# Patient Record
Sex: Female | Born: 1965 | Race: White | Hispanic: No | Smoking: Never smoker
Health system: Southern US, Community
[De-identification: ages and names within clinical notes are randomized; demographics above are authoritative.]

## PROBLEM LIST (undated history)

## (undated) DIAGNOSIS — Z789 Other specified health status: Secondary | ICD-10-CM

## (undated) DIAGNOSIS — Z8489 Family history of other specified conditions: Secondary | ICD-10-CM

## (undated) DIAGNOSIS — Z90710 Acquired absence of both cervix and uterus: Secondary | ICD-10-CM

## (undated) DIAGNOSIS — G43909 Migraine, unspecified, not intractable, without status migrainosus: Secondary | ICD-10-CM

## (undated) DIAGNOSIS — N83209 Unspecified ovarian cyst, unspecified side: Secondary | ICD-10-CM

## (undated) HISTORY — DX: Migraine, unspecified, not intractable, without status migrainosus: G43.909

## (undated) HISTORY — PX: OTHER SURGICAL HISTORY: SHX169

## (undated) HISTORY — DX: Unspecified ovarian cyst, unspecified side: N83.209

## (undated) HISTORY — PX: NO PAST SURGERIES: SHX2092

---

## 1998-03-08 ENCOUNTER — Other Ambulatory Visit: Admission: RE | Admit: 1998-03-08 | Discharge: 1998-03-08 | Payer: Self-pay | Admitting: Obstetrics & Gynecology

## 1999-01-19 ENCOUNTER — Other Ambulatory Visit: Admission: RE | Admit: 1999-01-19 | Discharge: 1999-01-19 | Payer: Self-pay | Admitting: Obstetrics & Gynecology

## 1999-04-24 ENCOUNTER — Other Ambulatory Visit: Admission: RE | Admit: 1999-04-24 | Discharge: 1999-04-24 | Payer: Self-pay | Admitting: Obstetrics & Gynecology

## 1999-04-24 ENCOUNTER — Encounter (INDEPENDENT_AMBULATORY_CARE_PROVIDER_SITE_OTHER): Payer: Self-pay | Admitting: Specialist

## 1999-07-30 ENCOUNTER — Other Ambulatory Visit: Admission: RE | Admit: 1999-07-30 | Discharge: 1999-07-30 | Payer: Self-pay | Admitting: Obstetrics & Gynecology

## 1999-12-12 ENCOUNTER — Other Ambulatory Visit: Admission: RE | Admit: 1999-12-12 | Discharge: 1999-12-12 | Payer: Self-pay | Admitting: Obstetrics & Gynecology

## 2001-01-02 ENCOUNTER — Other Ambulatory Visit: Admission: RE | Admit: 2001-01-02 | Discharge: 2001-01-02 | Payer: Self-pay | Admitting: Obstetrics & Gynecology

## 2002-01-18 ENCOUNTER — Other Ambulatory Visit: Admission: RE | Admit: 2002-01-18 | Discharge: 2002-01-18 | Payer: Self-pay | Admitting: Obstetrics & Gynecology

## 2003-04-18 ENCOUNTER — Other Ambulatory Visit: Admission: RE | Admit: 2003-04-18 | Discharge: 2003-04-18 | Payer: Self-pay | Admitting: Obstetrics & Gynecology

## 2004-01-03 ENCOUNTER — Encounter: Admission: RE | Admit: 2004-01-03 | Discharge: 2004-01-03 | Payer: Self-pay | Admitting: Family Medicine

## 2005-01-06 ENCOUNTER — Encounter: Payer: Self-pay | Admitting: Family Medicine

## 2005-01-21 ENCOUNTER — Other Ambulatory Visit: Admission: RE | Admit: 2005-01-21 | Discharge: 2005-01-21 | Payer: Self-pay | Admitting: Obstetrics & Gynecology

## 2005-02-07 ENCOUNTER — Ambulatory Visit: Payer: Self-pay | Admitting: Family Medicine

## 2005-04-25 ENCOUNTER — Ambulatory Visit: Payer: Self-pay | Admitting: Family Medicine

## 2007-09-10 ENCOUNTER — Ambulatory Visit: Payer: Self-pay | Admitting: Family Medicine

## 2007-09-17 ENCOUNTER — Encounter: Payer: Self-pay | Admitting: Family Medicine

## 2007-09-17 DIAGNOSIS — Z87898 Personal history of other specified conditions: Secondary | ICD-10-CM | POA: Insufficient documentation

## 2007-12-23 ENCOUNTER — Other Ambulatory Visit: Admission: RE | Admit: 2007-12-23 | Discharge: 2007-12-23 | Payer: Self-pay | Admitting: Family Medicine

## 2007-12-23 ENCOUNTER — Encounter: Payer: Self-pay | Admitting: Family Medicine

## 2007-12-23 ENCOUNTER — Ambulatory Visit: Payer: Self-pay | Admitting: Family Medicine

## 2007-12-23 DIAGNOSIS — N83209 Unspecified ovarian cyst, unspecified side: Secondary | ICD-10-CM | POA: Insufficient documentation

## 2007-12-28 LAB — CONVERTED CEMR LAB
ALT: 14 units/L (ref 0–35)
AST: 15 units/L (ref 0–37)
Basophils Absolute: 0.1 10*3/uL (ref 0.0–0.1)
Basophils Relative: 1.5 % (ref 0.0–3.0)
Bilirubin, Direct: 0.1 mg/dL (ref 0.0–0.3)
CO2: 28 meq/L (ref 19–32)
Chloride: 101 meq/L (ref 96–112)
Eosinophils Absolute: 0.2 10*3/uL (ref 0.0–0.7)
Eosinophils Relative: 3 % (ref 0.0–5.0)
GFR calc non Af Amer: 85 mL/min
Hemoglobin: 14.3 g/dL (ref 12.0–15.0)
MCV: 91.9 fL (ref 78.0–100.0)
Monocytes Absolute: 0.5 10*3/uL (ref 0.1–1.0)
Monocytes Relative: 6.5 % (ref 3.0–12.0)
Neutro Abs: 4.1 10*3/uL (ref 1.4–7.7)
Neutrophils Relative %: 56.4 % (ref 43.0–77.0)
Platelets: 262 10*3/uL (ref 150–400)
RDW: 11.9 % (ref 11.5–14.6)
Sodium: 139 meq/L (ref 135–145)
TSH: 2.41 microintl units/mL (ref 0.35–5.50)

## 2007-12-29 ENCOUNTER — Encounter: Payer: Self-pay | Admitting: Family Medicine

## 2008-02-09 ENCOUNTER — Encounter: Admission: RE | Admit: 2008-02-09 | Discharge: 2008-02-09 | Payer: Self-pay | Admitting: Family Medicine

## 2008-05-03 ENCOUNTER — Encounter (INDEPENDENT_AMBULATORY_CARE_PROVIDER_SITE_OTHER): Payer: Self-pay | Admitting: *Deleted

## 2009-02-14 ENCOUNTER — Encounter: Admission: RE | Admit: 2009-02-14 | Discharge: 2009-02-14 | Payer: Self-pay | Admitting: Family Medicine

## 2009-02-16 ENCOUNTER — Encounter (INDEPENDENT_AMBULATORY_CARE_PROVIDER_SITE_OTHER): Payer: Self-pay | Admitting: *Deleted

## 2009-09-19 ENCOUNTER — Ambulatory Visit: Payer: Self-pay | Admitting: Family Medicine

## 2009-09-19 ENCOUNTER — Encounter (INDEPENDENT_AMBULATORY_CARE_PROVIDER_SITE_OTHER): Payer: Self-pay | Admitting: *Deleted

## 2009-09-22 ENCOUNTER — Ambulatory Visit: Payer: Self-pay | Admitting: Gastroenterology

## 2009-10-10 ENCOUNTER — Ambulatory Visit: Payer: Self-pay | Admitting: Gastroenterology

## 2009-10-10 ENCOUNTER — Telehealth: Payer: Self-pay | Admitting: Gastroenterology

## 2009-10-30 ENCOUNTER — Ambulatory Visit: Payer: Self-pay | Admitting: Family Medicine

## 2009-11-01 ENCOUNTER — Telehealth: Payer: Self-pay | Admitting: Gastroenterology

## 2010-01-26 ENCOUNTER — Telehealth: Payer: Self-pay | Admitting: Family Medicine

## 2010-03-29 ENCOUNTER — Telehealth (INDEPENDENT_AMBULATORY_CARE_PROVIDER_SITE_OTHER): Payer: Self-pay | Admitting: *Deleted

## 2010-05-08 NOTE — Progress Notes (Signed)
Summary: Delined referral dated 09-2009 to GI for EGD   Phone Note Outgoing Call   Summary of Call: Pt declined to rescheduled EGD referral... Pt returned call says she is not having the problem via the referral any longer, and does not want to have the procedure.Daine Gip  January 26, 2010 4:16 PM  Initial call taken by: Daine Gip,  January 26, 2010 4:16 PM

## 2010-05-08 NOTE — Assessment & Plan Note (Signed)
Summary: SWOLLOWED GUM AND STILL STUCK/DLO   Vital Signs:  Patient profile:   45 year old female Height:      67 inches Weight:      146.25 pounds BMI:     22.99 Temp:     97.5 degrees F oral Pulse rate:   64 / minute Pulse rhythm:   regular BP sitting:   110 / 68  (left arm) Cuff size:   regular  Vitals Entered By: Delilah Shan CMA Duncan Dull) (September 19, 2009 8:48 AM) CC: Swallowed gum and it feels like it is stuck in throat   History of Present Illness: was in mtg and swallowed pc of gum 2 weeks ago  feels like it got stuck in throat   can eat and drink-- but cannot take pills  some heartburn at night  no sob or cough   was gaggy for 2 days- this is better  ? if that is what it is  no actual vomiting no abd pain   Allergies: 1)  ! Penicillin 2)  ! Sulfa  Past History:  Past Medical History: Last updated: 12/23/2007 family hx breast ca-- mother ovarian cysts   Past Surgical History: Last updated: 12/30/2007 Abnormal paps- colpo, bx ok (02/1998) pelvic ultrasound - ovarian cyst (9/09)  Family History: Last updated: 12/23/2007 Father:  Mother: elevated cholesterol, breast cancer  Siblings: 1 sister- high cholesterol  GF died of CHF 81 GM with alz at age 43   Social History: Last updated: 12/23/2007 Marital Status: Married Children: 1 daughter Occupation: [employer] non smoker  no alcohol   Risk Factors: Smoking Status: never (09/17/2007)  Review of Systems General:  Denies chills, fatigue, fever, and loss of appetite. CV:  Denies chest pain or discomfort, lightheadness, palpitations, and shortness of breath with exertion. Resp:  Denies cough, pleuritic, shortness of breath, and wheezing. GI:  Complains of indigestion; denies abdominal pain, bloody stools, change in bowel habits, diarrhea, nausea, vomiting, and vomiting blood. Derm:  Denies poor wound healing. Neuro:  Denies numbness and tingling. Heme:  Denies abnormal bruising and  bleeding.  Physical Exam  General:  Well-developed,well-nourished,in no acute distress; alert,appropriate and cooperative throughout examination Head:  normocephalic, atraumatic, and no abnormalities observed.   Eyes:  vision grossly intact, pupils equal, pupils round, and pupils reactive to light.   Mouth:  pharynx pink and moist.   Neck:  supple with full rom and no masses or thyromegally, no JVD or carotid bruit  Lungs:  Normal respiratory effort, chest expands symmetrically. Lungs are clear to auscultation, no crackles or wheezes. Heart:  Normal rate and regular rhythm. S1 and S2 normal without gallop, murmur, click, rub or other extra sounds. Abdomen:  Bowel sounds positive,abdomen soft and non-tender without masses, organomegaly or hernias noted. Skin:  Intact without suspicious lesions or rashes Cervical Nodes:  No lymphadenopathy noted Psych:  normal affect, talkative and pleasant    Impression & Recommendations:  Problem # 1:  FOREIGN BODY IN ESOPHAGUS (ICD-935.1) Assessment New pt swallowed gum 2 wk ago and now having discomfort and swallowing probs and a little heartburn ref to GI  inst to drink fluids/ chew food well/ avoid pills - update if worse  Orders: Gastroenterology Referral (GI)  Patient Instructions: 1)  keep drinking lots of fluids  2)  avoid pills when possible 3)  we will refer to GI at check out   Prior Medications (reviewed today): None Current Allergies (reviewed today): ! PENICILLIN ! SULFA

## 2010-05-08 NOTE — Letter (Signed)
Summary: Results Letter  [facility]  [address] [place], [place] [address]   Phone: [phone]  Fax: [phone]        September 22, 2009 MRN: [id number]    [facility] [address] [place], [place]  [address]    Dear Ms. Hornbaker,  It is my pleasure to have treated you recently as a new patient in my office. I appreciate your confidence and the opportunity to participate in your care.  Since I do have a busy inpatient endoscopy schedule and office schedule, my office hours vary weekly. I am, however, available for emergency calls everyday through my office. If I am not available for an urgent office appointment, another one of our gastroenterologist will be able to assist you.  My well-trained staff are prepared to help you at all times. For emergencies after office hours, a physician from our Gastroenterology section is always available through my 24 hour answering service  Once again I welcome you as a new patient and I look forward to a happy and healthy relationship             Sincerely,  Louis Meckel MD  This letter has been electronically signed by your physician.  Appended Document: Results Letter LETTER MAILED

## 2010-05-08 NOTE — Progress Notes (Signed)
Summary: cx fee dispute  Phone Note Call from Patient Call back at Home Phone [phone]   Caller: Patient Call For: Dr. Arlyce Dice Reason for Call: Talk to Nurse Summary of Call: pt upset that she was charged for canceling her EGD... EGD was sch'ed for 10/12/2009 and pt canceled at 8:13AM on 10/10/2009... canceled because she was feeling better and felt the procedure was no longer necessary... Dr. Arlyce Dice approved of the charge (EMR)... pt complaining that she called first thing in the morning on the 5th and couldnt call on the 4th because the office was closed... pt says she is not going to pay this penalty and would like to speak with someone about it Initial call taken by: Vallarie Mare,  November 01, 2009 9:00 AM  Follow-up for Phone Call        As patient cancelled early on 7/5 and we were able to fill the empty slot with another procedure, we will not charge her the cancellation fee.  Will ask Revonda Standard to call patient and let her know.  I will send email to [facility] asking them to write off the $100 cancellation fee. Follow-up by: Clarnce Flock,  November 07, 2009 12:24 PM  Additional Follow-up for Phone Call Additional follow up Details #1::        called pt at listed number and left a message on voicemail informing pt that she is no longer being held responsible for $100 cx fee, billing dept will be notified, this was possible b/c we were ale to fill her spot with another patient, if any questions c/b at [phone] Additional Follow-up by: Vallarie Mare,  November 07, 2009 1:24 PM

## 2010-05-08 NOTE — Assessment & Plan Note (Signed)
Summary: ?SINUS INFECTION/CLE   Vital Signs:  Patient profile:   45 year old female Height:      67 inches Weight:      146 pounds BMI:     22.95 Temp:     98.5 degrees F oral Pulse rate:   68 / minute Pulse rhythm:   regular BP sitting:   110 / 70  (left arm) Cuff size:   regular  Vitals Entered By: Lewanda Rife LPN (October 30, 2009 12:51 PM) CC: ? sinus infection, some sinus drainage at back of throat and dry cough at night time.   History of Present Illness: a lot of dry cough with a tickle at night - until she is very hoarse during the day yellowish phlegm- about a week only a little congested - last week  no headache  face is not hurting  no wheeze or sob  no fever at all   no n/v   throat is raw from cough- but not sore ears are ok   played 4 ballgames this weekend in the heat   reflux is well controlled   took some robitussin this weekend - helps a little   Allergies: 1)  ! Penicillin 2)  ! 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 Eyes:  Denies blurring and eye irritation. CV:  Denies chest pain or discomfort, palpitations, and shortness of breath with exertion. Resp:  Complains of cough, sputum productive, and wheezing;  denies pleuritic and shortness of breath. GI:  Denies abdominal pain, change in bowel habits, indigestion, nausea, and vomiting. Derm:  Denies itching and rash. Allergy:  Complains of seasonal allergies.  Physical Exam  General:  Well-developed,well-nourished,in no acute distress; alert,appropriate and cooperative throughout examination Head:  normocephalic, atraumatic, and no abnormalities observed.  no sinus tenderness Eyes:  vision grossly intact, pupils equal, pupils round, and pupils reactive to light.  no injection.   Ears:  R ear normal and L ear normal.   Nose:  L nare is congested and injected R nare clear  Mouth:  pharynx pink and moist, no erythema, and no exudates.   Neck:  supple with full rom and no masses or thyromegally, no JVD or carotid bruit  Lungs:  CTa with harsh bs at bases and scant wheeze on forced exp only no prolonged exp phase no rales or rhonchi  slt hoarseness  Heart:  Normal rate and regular rhythm. S1 and S2 normal without gallop, murmur, click, rub or other extra sounds. Skin:  Intact without suspicious lesions or rashes Cervical Nodes:  No lymphadenopathy noted Psych:  normal affect, talkative and pleasant    Impression & Recommendations:  Problem # 1:  BRONCHITIS- ACUTE (ICD-466.0) Assessment New  with cough that is mildly productive - 10 days, worse at night/ with scant wheeze on  exam  tx with zithromax given time course  guifen ac as needed with caution -nighttime and albuterol mdi update if worse or not imp  recommend sympt care- see pt instructions   Her updated medication list for this problem includes:    Zithromax Z-pak 250 Mg Tabs (Azithromycin) .Marland Kitchen... Take by mouth as directed    Guaifenesin Ac 100-10 Mg/39ml Syrp (Guaifenesin-codeine) .Marland Kitchen... 1-2 teaspoons by mouth up to every 4 hours - caution of sedation   for cough    Proair Hfa 108 (90 Base) Mcg/act Aers (Albuterol sulfate) .Marland Kitchen... 2 puffs at bedtime with bronchitis or as needed for  wheezing  Orders: Prescription Created Electronically [phone])  Complete Medication List: 1)  Tylenol 325 Mg Tabs (Acetaminophen) .... Take one by mouth as needed 2)  Benadryl 25 Mg Caps (Diphenhydramine hcl) .... Take one by mouth as needed 3)  Prilosec 20 Mg Cpdr (Omeprazole) .... Take one tab before breakfast daily as needed 4)  Zithromax Z-pak 250 Mg Tabs (Azithromycin) .... Take by mouth as directed 5)  Guaifenesin Ac 100-10 Mg/24ml Syrp (Guaifenesin-codeine) .Marland Kitchen.. 1-2 teaspoons by mouth up to every 4 hours - caution of sedation   for cough 6)  Proair Hfa 108 (90 Base) Mcg/act Aers (Albuterol sulfate) .... 2 puffs at bedtime with bronchitis or as needed for wheezing  Patient Instructions: 1)  take the zithromax as directed  2)  use cough syrup with caution , it can sedate  3)  use inhaler at bedtime and as needed  4)  if not improving in 3-4 days let me know (or if worse)  5)  drink lots of fluids  Prescriptions: PROAIR HFA 108 (90 BASE) MCG/ACT AERS (ALBUTEROL SULFATE) 2 puffs at bedtime with bronchitis or as needed for wheezing  #1 mdi x 0   Entered and Authorized by:   Judith Part MD   Signed by:   Judith Part MD on 10/30/2009   Method used:   Electronically to        [facility]* (retail)       [address]       [place], [place]  [address]       Ph: [phone]       Fax: [phone]   RxID:   [id number] GUAIFENESIN AC 100-10 MG/5ML SYRP (GUAIFENESIN-CODEINE) 1-2 teaspoons by mouth up to every 4 hours - caution of sedation   for cough  #120cc x 0   Entered and Authorized by:   Judith Part MD   Signed by:   Judith Part MD on 10/30/2009   Method used:   Print then Give to Patient   RxID:   [id number] ZITHROMAX Z-PAK 250 MG TABS (AZITHROMYCIN) take by mouth as directed  #1 pack x 0   Entered and Authorized by:   Judith Part MD   Signed by:   Judith Part MD on 10/30/2009   Method used:   Electronically to        [facility]*  (retail)       [address]       [place], [place]  [address]       Ph: [phone]       Fax: [phone]   RxID:   [id number]   Current Allergies (reviewed today): ! PENICILLIN ! SULFA

## 2010-05-08 NOTE — Letter (Signed)
Summary: EGD Instructions  [facility]  [address] [place], [place] [address]   Phone: [phone]  Fax: [phone]       Alicia Robles    February 24, 1966    MRN: [id number]       Procedure Day /Date:THURSDAY 10/12/2009     Arrival Time: 1:30PM     Procedure Time:2:30PM     Location of Procedure:                    X  [facility] (4th Floor)  PREPARATION FOR ENDOSCOPY/DIL   On 10/12/2009  THE DAY OF THE PROCEDURE:  1.   No solid foods, milk or milk products are allowed after midnight the night before your procedure.  2.   Do not drink anything colored red or purple.  Avoid juices with pulp.  No orange juice.  3.  You may drink clear liquids until12:30PM  which is 2 hours before your procedure.                                                                                                CLEAR LIQUIDS INCLUDE: Water Jello Ice Popsicles Tea (sugar ok, no milk/cream) Powdered fruit flavored drinks Coffee (sugar ok, no milk/cream) Gatorade Juice: apple, white grape, white cranberry  Lemonade Clear bullion, consomm, broth Carbonated beverages (any kind) Strained chicken noodle soup Hard Candy   MEDICATION INSTRUCTIONS  Unless otherwise instructed, you should take regular prescription medications with a small sip of water as early as possible the morning of your procedure.            OTHER INSTRUCTIONS  You will need a responsible adult at least 43 years of age to accompany you and drive you home.   This person must remain in the waiting room during your procedure.  Wear loose fitting clothing that is easily removed.  Leave jewelry and other valuables at home.  However, you may wish to bring a book to read or an iPod/MP3 player to listen to music as you wait for your procedure to start.  Remove all body piercing jewelry and leave at home.  Total time from sign-in until discharge is approximately 2-3 hours.  You should go home directly  after your procedure and rest.  You can resume normal activities the day after your procedure.  The day of your procedure you should not:   Drive   Make legal decisions   Operate machinery   Drink alcohol   Return to work  You will receive specific instructions about eating, activities and medications before you leave.    The above instructions have been reviewed and explained to me by   _______________________    I fully understand and can verbalize these instructions _____________________________ Date _________

## 2010-05-08 NOTE — Letter (Signed)
Summary: New Patient letter  [facility]  [address] [place], [place] [address]   Phone: [phone]  Fax: [phone]       09/19/2009 MRN: [id number]  KELISE [facility] [address] [place], [place]  [address]  Dear Ms. Netter,  Welcome to the Gastroenterology Division at [facility].    You are scheduled to see Dr.  Arlyce Dice on 09-22-09 at 10:45a.m. on the 3rd floor at [facility], [address]  We ask that you try to arrive at our office 15 minutes prior to your appointment time to allow for check-in.  We would like you to complete the enclosed self-administered evaluation form prior to your visit and bring it with you on the day of your appointment.  We will review it with you.  Also, please bring a complete list of all your medications or, if you prefer, bring the medication bottles and we will list them.  Please bring your insurance card so that we may make a copy of it.  If your insurance requires a referral to see a specialist, please bring your referral form from your primary care physician.  Co-payments are due at the time of your visit and may be paid by cash, check or credit card.     Your office visit will consist of a consult with your physician (includes a physical exam), any laboratory testing he/she may order, scheduling of any necessary diagnostic testing (e.g. x-ray, ultrasound, CT-scan), and scheduling of a procedure (e.g. Endoscopy, Colonoscopy) if required.  Please allow enough time on your schedule to allow for any/all of these possibilities.    If you cannot keep your appointment, please call [phone] to cancel or reschedule prior to your appointment date.  This allows [place] the opportunity to schedule an appointment for another patient in need of care.  If you do not cancel or reschedule by 5 p.m. the business day prior to your appointment date, you will be charged a $50.00 late cancellation/no-show fee.    Thank you for choosing  [facility] for your medical needs.  We appreciate the opportunity to care for you.  Please visit [place] at our website  to learn more about our practice.                     Sincerely,                                                             The Gastroenterology Division

## 2010-05-08 NOTE — Progress Notes (Signed)
Summary: Canceled Endo  Phone Note Call from Patient   Caller: Patient Call For: Dr. Arlyce Dice Summary of Call: pt. canceled her Endo for 10-12-09 b/c she is feeling much better and does not feel like it is necessary at this time. Would you like pt. charged the cancelation fee? Initial call taken by: Karna Christmas,  October 10, 2009 8:13 AM  Follow-up for Phone Call        Yes- If it is within the penalty period Follow-up by: Louis Meckel MD,  October 12, 2009 8:21 AM  Additional Follow-up for Phone Call Additional follow up Details #1::        Patient BILLED. Additional Follow-up by: Leanor Kail [facility],  October 17, 2009 4:31 PM

## 2010-05-08 NOTE — Assessment & Plan Note (Signed)
Summary: trouble swallowing--ch.   History of Present Illness Visit Type: Initial Consult Primary GI MD: Melvia Heaps MD [facility] Primary Provider: Roxy Manns, MD Requesting Provider: Roxy Manns, MD Chief Complaint: Patient swallowed a piece of gum two weeks ago and she feels like it is still there. She states that since then she is having alot of solid food dysphagia.  History of Present Illness:   Ms. Douse is a  pleasant 45 year old white female referred at the request of Dr. Milinda Antis evaluation of dysphagia.  Approximately 2 weeks ago she swallowed gum.  She has had a sensation of something in her esophagus since that time.  In addition, she is now complaining of mild dysphagia to solids.  She has occasional pyrosis, especially at night.   GI Review of Systems    Reports dysphagia with solids.      Denies abdominal pain, acid reflux, belching, bloating, chest pain, dysphagia with liquids, heartburn, loss of appetite, nausea, vomiting, vomiting blood, weight loss, and  weight gain.        Denies anal fissure, black tarry stools, change in bowel habit, constipation, diarrhea, diverticulosis, fecal incontinence, heme positive stool, hemorrhoids, irritable bowel syndrome, jaundice, light color stool, liver problems, rectal bleeding, and  rectal pain.    Current Medications (verified): 1)  Tylenol 325 Mg Tabs (Acetaminophen) .... Take One By Mouth As Needed 2)  Benadryl 25 Mg Caps (Diphenhydramine Hcl) .... Take One By Mouth As Needed  Allergies (verified): 1)  ! Penicillin 2)  ! Sulfa  Past History:  Past Medical History: Reviewed history from 12/23/2007 and no changes required. family hx breast ca-- mother ovarian cysts   Past Surgical History: Reviewed history from 12/30/2007 and no changes required. Abnormal paps- colpo, bx ok (02/1998) pelvic ultrasound - ovarian cyst (9/09)  Family History: Mother: elevated cholesterol, breast cancer  Siblings: 1 sister- high cholesterol   GF died of CHF 55 GM with alz at age 68  No FH of Colon Cancer:  Social History: Marital Status: Married Children: 1 daughter Occupation: Human resources officer non smoker  no alcohol  Daily Caffeine Use 1 per day  Review of Systems  The patient denies allergy/sinus, anemia, anxiety-new, arthritis/joint pain, back pain, blood in urine, breast changes/lumps, change in vision, confusion, cough, coughing up blood, depression-new, fainting, fatigue, fever, headaches-new, hearing problems, heart murmur, heart rhythm changes, itching, menstrual pain, muscle pains/cramps, night sweats, nosebleeds, pregnancy symptoms, shortness of breath, skin rash, sleeping problems, sore throat, swelling of feet/legs, swollen lymph glands, thirst - excessive , urination - excessive , urination changes/pain, urine leakage, vision changes, and voice change.    Vital Signs:  Patient profile:   45 year old female Height:      67 inches Weight:      146.4 pounds BMI:     23.01 Pulse rate:   66 / minute Pulse rhythm:   regular BP sitting:   110 / 70  (left arm) Cuff size:   regular  Vitals Entered By: Harlow Mares CMA Duncan Dull) (September 22, 2009 10:59 AM)  Physical Exam  Additional Exam:  On physical exam she is a healthy-appearing female  skin: anicteric HEENT: normocephalic; PEERLA; no nasal or pharyngeal abnormalities neck: supple nodes: no cervical lymphadenopathy chest: clear to ausculatation and percussion heart: no murmurs, gallops, or rubs abd: soft, nontender; BS normoactive; no abdominal masses, tenderness, organomegaly rectal: deferred ext: no cynanosis, clubbing, edema skeletal: no deformities neuro: oriented x 3; no focal abnormalities    Impression &  Recommendations:  Problem # 1:  DYSPHAGIA UNSPECIFIED (ICD-787.20)  The patient's symptoms could be due to a fixed stricture of  the esophagus or esophagitis causing the sensation of a foreign body.  Recommendations #1 begin  omeprazole 20 mg a day #2 upper endoscopy with dilatation as indicated  Risks, alternatives, and complications of the procedure, including bleeding, perforation, and possible need for surgery, were explained to the patient.  Patient's questions were answered.  Orders: EGD (EGD)  Patient Instructions: 1)  Copy sent to : Roxy Manns, MD 2)  Your EGD is scheduled for 10/12/2009 at 2:30pm 3)  We are sending in a new prescrition to your pharmacy today 4)  Conscious Sedation brochure given.  5)  Upper Endoscopy with Dilatation brochure given.  6)  The medication list was reviewed and reconciled.  All changed / newly prescribed medications were explained.  A complete medication list was provided to the patient / caregiver. Prescriptions: PRILOSEC 20 MG CPDR (OMEPRAZOLE) take one tab before breakfast daily  #30 x 1   Entered and Authorized by:   Louis Meckel MD   Signed by:   Louis Meckel MD on 09/22/2009   Method used:   Electronically to        [facility]* (retail)       [address]       [place], [place]  [address]       Ph: [phone]       Fax: [phone]   RxID:   [id number]

## 2010-05-10 NOTE — Progress Notes (Signed)
Summary: Referral cancelled   Phone Note Outgoing Call   Summary of Call: Pt cancelled the GI appt w/ Dr. Arlyce Dice. Stated she was feeling better.Daine Gip  March 29, 2010 4:21 PM  Initial call taken by: Daine Gip,  March 29, 2010 4:21 PM  Follow-up for Phone Call        thanks for the update  please alert me if her symptoms return Follow-up by: Judith Part MD,  March 29, 2010 6:35 PM

## 2010-05-14 ENCOUNTER — Telehealth (INDEPENDENT_AMBULATORY_CARE_PROVIDER_SITE_OTHER): Payer: Self-pay | Admitting: *Deleted

## 2010-05-16 ENCOUNTER — Other Ambulatory Visit (INDEPENDENT_AMBULATORY_CARE_PROVIDER_SITE_OTHER): Payer: BC Managed Care – PPO

## 2010-05-16 ENCOUNTER — Other Ambulatory Visit: Payer: Self-pay | Admitting: Family Medicine

## 2010-05-16 ENCOUNTER — Encounter (INDEPENDENT_AMBULATORY_CARE_PROVIDER_SITE_OTHER): Payer: Self-pay | Admitting: *Deleted

## 2010-05-16 DIAGNOSIS — E785 Hyperlipidemia, unspecified: Secondary | ICD-10-CM

## 2010-05-16 DIAGNOSIS — Z0000 Encounter for general adult medical examination without abnormal findings: Secondary | ICD-10-CM

## 2010-05-16 DIAGNOSIS — E7800 Pure hypercholesterolemia, unspecified: Secondary | ICD-10-CM

## 2010-05-16 LAB — HEPATIC FUNCTION PANEL
ALT: 12 U/L (ref 0–35)
Albumin: 4.4 g/dL (ref 3.5–5.2)
Bilirubin, Direct: 0.1 mg/dL (ref 0.0–0.3)
Total Protein: 7.3 g/dL (ref 6.0–8.3)

## 2010-05-16 LAB — CBC WITH DIFFERENTIAL/PLATELET
Basophils Absolute: 0 10*3/uL (ref 0.0–0.1)
Basophils Relative: 0.4 % (ref 0.0–3.0)
Eosinophils Absolute: 0.1 10*3/uL (ref 0.0–0.7)
Eosinophils Relative: 1.8 % (ref 0.0–5.0)
HCT: 42.4 % (ref 36.0–46.0)
Lymphocytes Relative: 31.8 % (ref 12.0–46.0)
MCHC: 34.5 g/dL (ref 30.0–36.0)
MCV: 92.2 fl (ref 78.0–100.0)
Monocytes Relative: 5.6 % (ref 3.0–12.0)
Neutro Abs: 3.8 10*3/uL (ref 1.4–7.7)
Neutrophils Relative %: 60.4 % (ref 43.0–77.0)
Platelets: 271 10*3/uL (ref 150.0–400.0)
RBC: 4.6 Mil/uL (ref 3.87–5.11)

## 2010-05-16 LAB — BASIC METABOLIC PANEL
BUN: 11 mg/dL (ref 6–23)
Chloride: 105 mEq/L (ref 96–112)
Potassium: 4.1 mEq/L (ref 3.5–5.1)
Sodium: 138 mEq/L (ref 135–145)

## 2010-05-16 LAB — LDL CHOLESTEROL, DIRECT: Direct LDL: 152 mg/dL

## 2010-05-16 LAB — LIPID PANEL: Cholesterol: 213 mg/dL — ABNORMAL HIGH (ref 0–200)

## 2010-05-23 ENCOUNTER — Other Ambulatory Visit: Payer: Self-pay | Admitting: Family Medicine

## 2010-05-23 ENCOUNTER — Other Ambulatory Visit (HOSPITAL_COMMUNITY)
Admission: RE | Admit: 2010-05-23 | Discharge: 2010-05-23 | Disposition: A | Discharge status: Home / Self Care | Payer: BC Managed Care – PPO | Source: Ambulatory Visit | Attending: Family Medicine | Admitting: Family Medicine

## 2010-05-23 ENCOUNTER — Encounter: Payer: Self-pay | Admitting: Family Medicine

## 2010-05-23 ENCOUNTER — Encounter (INDEPENDENT_AMBULATORY_CARE_PROVIDER_SITE_OTHER): Payer: BC Managed Care – PPO | Admitting: Family Medicine

## 2010-05-23 DIAGNOSIS — E7800 Pure hypercholesterolemia, unspecified: Secondary | ICD-10-CM

## 2010-05-23 DIAGNOSIS — Z1159 Encounter for screening for other viral diseases: Secondary | ICD-10-CM | POA: Insufficient documentation

## 2010-05-23 DIAGNOSIS — N946 Dysmenorrhea, unspecified: Secondary | ICD-10-CM

## 2010-05-23 DIAGNOSIS — R109 Unspecified abdominal pain: Secondary | ICD-10-CM | POA: Insufficient documentation

## 2010-05-23 DIAGNOSIS — Z1231 Encounter for screening mammogram for malignant neoplasm of breast: Secondary | ICD-10-CM

## 2010-05-23 DIAGNOSIS — Z0000 Encounter for general adult medical examination without abnormal findings: Secondary | ICD-10-CM

## 2010-05-23 DIAGNOSIS — Z01419 Encounter for gynecological examination (general) (routine) without abnormal findings: Secondary | ICD-10-CM

## 2010-05-23 DIAGNOSIS — Z124 Encounter for screening for malignant neoplasm of cervix: Secondary | ICD-10-CM | POA: Insufficient documentation

## 2010-05-23 DIAGNOSIS — N83209 Unspecified ovarian cyst, unspecified side: Secondary | ICD-10-CM

## 2010-05-23 LAB — CYTOLOGY - PAP: Pap Smear: NORMAL

## 2010-05-24 ENCOUNTER — Ambulatory Visit
Admission: RE | Admit: 2010-05-24 | Discharge: 2010-05-24 | Disposition: A | Discharge status: Home / Self Care | Payer: BC Managed Care – PPO | Source: Ambulatory Visit | Attending: Family Medicine | Admitting: Family Medicine

## 2010-05-24 DIAGNOSIS — N946 Dysmenorrhea, unspecified: Secondary | ICD-10-CM

## 2010-05-24 DIAGNOSIS — N83209 Unspecified ovarian cyst, unspecified side: Secondary | ICD-10-CM

## 2010-05-24 NOTE — Progress Notes (Signed)
----   Converted from flag ---- ---- 05/13/2010 3:43 PM, Colon Flattery Tower MD wrote: please check wellness and lipid v70.0 and 272  thanks  ---- 05/11/2010 11:36 AM, Liane Comber CMA (AAMA) wrote: Lab orders please! Good Morning! This pt is scheduled for cpx labs Wed, which labs to draw and dx codes to use? Thanks Tasha ------------------------------

## 2010-05-25 ENCOUNTER — Ambulatory Visit
Admission: RE | Admit: 2010-05-25 | Discharge: 2010-05-25 | Disposition: A | Discharge status: Home / Self Care | Payer: BC Managed Care – PPO | Source: Ambulatory Visit | Attending: Family Medicine | Admitting: Family Medicine

## 2010-05-25 DIAGNOSIS — Z1231 Encounter for screening mammogram for malignant neoplasm of breast: Secondary | ICD-10-CM

## 2010-05-29 ENCOUNTER — Encounter: Payer: Self-pay | Admitting: Family Medicine

## 2010-05-30 NOTE — Assessment & Plan Note (Signed)
Summary: CPX W/PAP CYD   Vital Signs:  Patient profile:   45 year old female Height:      67 inches Weight:      144.50 pounds BMI:     22.71 Temp:     98.2 degrees F oral Pulse rate:   80 / minute Pulse rhythm:   regular BP sitting:   108 / 76  (left arm) Cuff size:   regular  Vitals Entered By: Lewanda Rife LPN (May 23, 2010 9:47 AM) CC: CPX with pap and breast exam LMP 05-07-10   History of Present Illness: here for health mt exam / gyn care and to disc chronic med problems  feeling ok  has a cold - scratchy throat and runny nose   wt is down 2 lb with bmi of 22 is eating fair  is back to the gym -- just starting this week (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 Penicillin 2)  ! 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 Eyes:  Denies blurring and eye irritation. CV:  Denies chest pain or discomfort, lightheadness, and palpitations. Resp:  Denies cough, shortness of breath, and wheezing. GI:  Denies abdominal pain, change in bowel habits, indigestion, nausea, and vomiting. GU:  Denies abnormal vaginal bleeding, discharge, dysuria, and urinary frequency. MS:  Denies muscle aches and cramps. Derm:  Denies itching, lesion(s), poor wound healing, and rash. Neuro:  Denies headaches, numbness, and tingling. Psych:  Denies anxiety and depression. Endo:  Denies cold intolerance, excessive thirst, excessive urination, and heat intolerance. Heme:  Denies abnormal bruising and bleeding.  Physical Exam  Genitalia:  Normal introitus for age, no external lesions, no vaginal discharge, mucosa pink and moist, no vaginal or cervical lesions, no vaginal atrophy, no friaility or hemorrhage, normal uterus size and position, no adnexal masses or tenderness   Impression & Recommendations:  Problem # 1:  HEALTH MAINTENANCE EXAM (ICD-V70.0) Assessment Comment Only reviewed health habits including diet, exercise and skin cancer prevention reviewed health maintenance list and family history  reviewed wellness labs in detail  Problem # 2:  ROUTINE GYNECOLOGICAL EXAMINATION (ICD-V72.31) Assessment: Unchanged annual exam  no abn findings  will investigate pelvic pain with another [place]   Problem # 3:  PELVIC  PAIN (ICD-789.09) Assessment: Deteriorated schedule [place]  suspect recurrent cyst pt does not desire gyn ref or OC at this time unless symptoms progress Her updated medication list for this problem includes:    Tylenol 325 Mg Tabs (Acetaminophen) .Marland Kitchen... Take one by mouth as needed  Orders: Radiology Referral (Radiology)  Problem # 4:  HYPERCHOLESTEROLEMIA  (ICD-272.0) Assessment: Deteriorated this is worse rev low sat fat diet in detail and pt thinks she can do much better rev labs in detail at pt request- will not check again until next year at [facility]  Complete Medication List: 1)  Tylenol 325 Mg Tabs (Acetaminophen) .... Take one by mouth as needed 2)  Benadryl 25 Mg Caps (Diphenhydramine hcl) .... Take one by mouth as needed 3)  Proair Hfa 108 (90 Base) Mcg/act Aers (Albuterol sulfate) .... 2 puffs at bedtime with bronchitis or as needed for wheezing 4)  Echinacea ?mg  .... Two tablets by mouth daily 5)  Vitamin C ?mg  .... Take 1 tablet by mouth once a day  Patient Instructions: 1)  get back on track with exercise and diet 2)  you can raise your HDL (good cholesterol) by increasing exercise and eating omega 3 fatty acid supplement like fish oil or flax seed oil over the counter 3)  you can lower LDL (bad cholesterol) by limiting saturated fats in diet like red meat, fried foods, egg yolks, fatty breakfast meats, high fat dairy products and shellfish  4)  we will schedule pelvic ultrasound and mammogram at check out    Orders Added: 1)  Radiology Referral [Radiology] 2)  Radiology Referral [Radiology] 3)  Est. Patient 40-64 years [99396] 4)  Est. Patient Level II [04540]    Current Allergies (reviewed today): ! PENICILLIN ! SULFA

## 2010-05-31 ENCOUNTER — Encounter (INDEPENDENT_AMBULATORY_CARE_PROVIDER_SITE_OTHER): Payer: Self-pay | Admitting: *Deleted

## 2010-06-05 NOTE — Letter (Signed)
Summary: Results Follow up Letter  [place] at [facility]  [address] [place], [place] [address]   Phone: [phone]  Fax: [phone]    05/31/2010 MRN: [id number]  KRYSTALYN KUBOTA [address] [place], [place]  [address]  [place]  Dear Ms. Velez,  The following are the results of your recent test(s):  Test         Result    Pap Smear:        Normal __X___  Not Normal _____ Comments: ______________________________________________________ Cholesterol: LDL(Bad cholesterol):         Your goal is less than:         HDL (Good cholesterol):       Your goal is more than: Comments:  ______________________________________________________ Mammogram:        Normal _____  Not Normal _____ Comments:  ___________________________________________________________________ Hemoccult:        Normal _____  Not normal _______ Comments:    _____________________________________________________________________ Other Tests:    We routinely do not discuss normal results over the telephone.  If you desire a copy of the results, or you have any questions about this information we can discuss them at your next office visit.   Sincerely,       Sharilyn Sites for Dr. Roxy Manns

## 2010-06-05 NOTE — Letter (Signed)
Summary: Results Follow up Letter  [place] at [facility]  [address] [place], [place] [address]   Phone: [phone]  Fax: [phone]    05/29/2010 MRN: [id number]  JENISSA TYRELL [address] [place], [place]  [address]  [place]  Dear Ms. Bolio,  The following are the results of your recent test(s):  Test         Result    Pap Smear:        Normal _____  Not Normal _____ Comments: ______________________________________________________ Cholesterol: LDL(Bad cholesterol):         Your goal is less than:         HDL (Good cholesterol):       Your goal is more than: Comments:  ______________________________________________________ Mammogram:        Normal __X___  Not Normal _____ Comments: mammogram is normal, next mammogram due in 1 year  ___________________________________________________________________ Hemoccult:        Normal _____  Not normal _______ Comments:    _____________________________________________________________________ Other Tests:    We routinely do not discuss normal results over the telephone.  If you desire a copy of the results, or you have any questions about this information we can discuss them at your next office visit.   Sincerely,      Roxy Manns, MD

## 2010-10-11 ENCOUNTER — Telehealth: Payer: Self-pay | Admitting: *Deleted

## 2010-10-11 DIAGNOSIS — N83209 Unspecified ovarian cyst, unspecified side: Secondary | ICD-10-CM

## 2010-10-11 NOTE — Telephone Encounter (Signed)
Will do ref  Unsure if Dr Vincente Poli is taking new pts right now - but they have other great docs too Will route to [facility]

## 2010-10-11 NOTE — Telephone Encounter (Signed)
Patient says that she has had a hard time w/ ovarian cysts and is asking if you could refer her to a gynecologist. She wants to go to [facility] and would like to see Dr. Vincente Poli

## 2010-10-12 NOTE — Telephone Encounter (Signed)
Great - thanks

## 2010-10-12 NOTE — Telephone Encounter (Signed)
Working on scheduling appointment w/ Dr. Thom Chimes

## 2010-10-30 ENCOUNTER — Telehealth: Payer: Self-pay | Admitting: *Deleted

## 2010-10-30 MED ORDER — SCOPOLAMINE 1 MG/3DAYS TD PT72: 1.0000 | MEDICATED_PATCH | TRANSDERMAL | Status: AC

## 2010-10-30 NOTE — Telephone Encounter (Signed)
Patient notified of rx, warned of sedation.

## 2010-10-30 NOTE — Telephone Encounter (Signed)
Patient is going out on a boat this weekend and is requesting motion sickness patches. Uses [facility].

## 2010-10-30 NOTE — Telephone Encounter (Signed)
Please warn of potential sedation  Px sent electronically

## 2010-10-30 NOTE — Telephone Encounter (Signed)
Left v/m for pt to call back. Rx printed so med called in to [facility] pharmacy as instructed. Also left message with call in to add note may cause sedation.

## 2011-05-29 ENCOUNTER — Other Ambulatory Visit: Payer: Self-pay | Admitting: Family Medicine

## 2011-05-29 DIAGNOSIS — Z1231 Encounter for screening mammogram for malignant neoplasm of breast: Secondary | ICD-10-CM

## 2011-06-05 ENCOUNTER — Ambulatory Visit
Admission: RE | Admit: 2011-06-05 | Discharge: 2011-06-05 | Disposition: A | Discharge status: Home / Self Care | Payer: BC Managed Care – PPO | Source: Ambulatory Visit | Attending: Family Medicine | Admitting: Family Medicine

## 2011-06-05 DIAGNOSIS — Z1231 Encounter for screening mammogram for malignant neoplasm of breast: Secondary | ICD-10-CM

## 2011-06-07 ENCOUNTER — Other Ambulatory Visit: Payer: Self-pay | Admitting: Family Medicine

## 2011-06-07 DIAGNOSIS — R928 Other abnormal and inconclusive findings on diagnostic imaging of breast: Secondary | ICD-10-CM

## 2011-06-18 ENCOUNTER — Ambulatory Visit
Admission: RE | Admit: 2011-06-18 | Discharge: 2011-06-18 | Disposition: A | Discharge status: Home / Self Care | Payer: BC Managed Care – PPO | Source: Ambulatory Visit | Attending: Family Medicine | Admitting: Family Medicine

## 2011-06-18 DIAGNOSIS — R928 Other abnormal and inconclusive findings on diagnostic imaging of breast: Secondary | ICD-10-CM

## 2011-06-20 ENCOUNTER — Encounter: Payer: Self-pay | Admitting: *Deleted

## 2011-12-30 ENCOUNTER — Encounter: Payer: Self-pay | Admitting: Gastroenterology

## 2011-12-30 ENCOUNTER — Ambulatory Visit (INDEPENDENT_AMBULATORY_CARE_PROVIDER_SITE_OTHER): Payer: BC Managed Care – PPO | Admitting: Gastroenterology

## 2011-12-30 VITALS — BP 100/60 | HR 70 | Ht 67.5 in | Wt 152.6 lb

## 2011-12-30 DIAGNOSIS — R109 Unspecified abdominal pain: Secondary | ICD-10-CM

## 2011-12-30 NOTE — Assessment & Plan Note (Signed)
Pelvic or left lower quadrant pain is clearly coincident with onset of the patient's menstrual period strongly suggesting that this a  primary GYN problem, perhaps related to endometriosis. I think it is doubtful that she has a primary colonic abnormality. At this point I will hold further GI workup and defer to GYN for further workup.

## 2011-12-30 NOTE — Progress Notes (Signed)
History of Present Illness: Pleasant 46 year old white female referred at the request of Dr.  Billy Coast for evaluation of abdominal pain. For several years she  has predictably developed sharp, severe left lower quadrant pain at the onset of her menstrual period. This is followed by diarrhea and vomiting and relief of her pain.  In between she has no GI complaints including abdominal pain, diarrhea or constipation. There is no history of rectal bleeding. More recently she's had very mild pain in the left lower quadrant that she associates with ovulation.  Pelvic ultrasound in February, 2012 was unremarkable    Past Medical History  Diagnosis Date  . Other and unspecified ovarian cyst   . Migraines    Past Surgical History  Procedure Date  . None    family history includes Breast cancer in her maternal aunt and mother; Diabetes in her maternal uncle; and Heart disease in her maternal aunt and maternal uncle.  There is no history of Colon cancer. Current Outpatient Prescriptions  Medication Sig Dispense Refill  . diphenhydrAMINE (BENADRYL ALLERGY) 25 mg capsule Take 25 mg by mouth every 6 (six) hours as needed.       Allergies as of 12/30/2011 - Review Complete 12/30/2011  Allergen Reaction Noted  . Penicillins    . Sulfonamide derivatives      reports that she has never smoked. She has never used smokeless tobacco. She reports that she does not drink alcohol or use illicit drugs.     Review of Systems: Pertinent positive and negative review of systems were noted in the above HPI section. All other review of systems were otherwise negative.  Vital signs were reviewed in today's medical record Physical Exam: General: Well developed , well nourished, no acute distress Head: Normocephalic and atraumatic Eyes:  sclerae anicteric, EOMI Ears: Normal auditory acuity Mouth: No deformity or lesions Neck: Supple, no masses or thyromegaly Lungs: Clear throughout to auscultation Heart: Regular  rate and rhythm; no murmurs, rubs or bruits Abdomen: Soft, non tender and non distended. No masses, hepatosplenomegaly or hernias noted. Normal Bowel sounds Rectal:deferred Musculoskeletal: Symmetrical with no gross deformities  Skin: No lesions on visible extremities Pulses:  Normal pulses noted Extremities: No clubbing, cyanosis, edema or deformities noted Neurological: Alert oriented x 4, grossly nonfocal Cervical Nodes:  No significant cervical adenopathy Inguinal Nodes: No significant inguinal adenopathy Psychological:  Alert and cooperative. Normal mood and affect

## 2011-12-30 NOTE — Patient Instructions (Addendum)
Follow up as needed

## 2012-02-10 ENCOUNTER — Other Ambulatory Visit: Payer: BC Managed Care – PPO

## 2012-02-10 ENCOUNTER — Telehealth: Payer: Self-pay | Admitting: Family Medicine

## 2012-02-10 DIAGNOSIS — Z0000 Encounter for general adult medical examination without abnormal findings: Secondary | ICD-10-CM

## 2012-02-10 DIAGNOSIS — E7800 Pure hypercholesterolemia, unspecified: Secondary | ICD-10-CM

## 2012-02-10 NOTE — Telephone Encounter (Signed)
Message copied by Judy Pimple on Mon Feb 10, 2012 11:37 AM ------      Message from: Alvina Chou      Created: Wed Feb 05, 2012 11:20 AM      Regarding: lab orders for Nov. 4th        Patient is scheduled for CPX labs, please order future labs, Thanks , Camelia Eng

## 2012-02-12 ENCOUNTER — Encounter: Payer: BC Managed Care – PPO | Admitting: Family Medicine

## 2012-02-12 ENCOUNTER — Encounter (HOSPITAL_COMMUNITY): Payer: Self-pay | Admitting: Pharmacist

## 2012-02-19 ENCOUNTER — Encounter (HOSPITAL_COMMUNITY): Payer: Self-pay

## 2012-02-19 ENCOUNTER — Encounter (HOSPITAL_COMMUNITY)
Admission: RE | Admit: 2012-02-19 | Discharge: 2012-02-19 | Disposition: A | Discharge status: Home / Self Care | Payer: BC Managed Care – PPO | Source: Ambulatory Visit | Attending: Obstetrics and Gynecology | Admitting: Obstetrics and Gynecology

## 2012-02-19 HISTORY — DX: Family history of other specified conditions: Z84.89

## 2012-02-19 HISTORY — DX: Other specified health status: Z78.9

## 2012-02-19 LAB — SURGICAL PCR SCREEN: Staphylococcus aureus: NEGATIVE

## 2012-02-19 LAB — CBC
HCT: 42.1 % (ref 36.0–46.0)
MCH: 31.4 pg (ref 26.0–34.0)
MCHC: 35.4 g/dL (ref 30.0–36.0)
MCV: 88.8 fL (ref 78.0–100.0)
RDW: 11.6 % (ref 11.5–15.5)

## 2012-02-19 NOTE — Patient Instructions (Addendum)
Your procedure is scheduled on:02/26/12  Enter through the Main Entrance at :1130 am per Dr.'s office  Pick up desk phone and dial [phone] and inform us of your arrival.  Please call [phone] if you have any problems the morning of surgery.  Remember: Do not eat  after midnight: Tuesday Clear liquids ok until 9am Wed   Take these meds the morning of surgery with a sip of water:none  DO NOT wear jewelry, eye make-up, lipstick,body lotion, or dark fingernail polish. Do not shave for 48 hours prior to surgery.  If you are to be admitted after surgery, leave suitcase in car until your room has been assigned.

## 2012-02-21 ENCOUNTER — Other Ambulatory Visit: Payer: Self-pay | Admitting: Obstetrics and Gynecology

## 2012-02-25 MED ORDER — CEFAZOLIN SODIUM-DEXTROSE 2-3 GM-% IV SOLR
2.0000 g | INTRAVENOUS | Status: AC
  Administered 2012-02-26: 2 g via INTRAVENOUS

## 2012-02-25 NOTE — H&P (Signed)
NAME:  Alicia Robles, Alicia Robles                ACCOUNT NO.:  [id number]  MEDICAL RECORD NO.:  [id number]  LOCATION:  PERIO                         FACILITY:  [facility]  PHYSICIAN:  Lenoard Aden, M.D.DATE OF BIRTH:  [birth year]  DATE OF ADMISSION:  02/04/2012 DATE OF DISCHARGE:                             HISTORY & PHYSICAL   CHIEF COMPLAINT:  Dysmenorrhea and left lower quadrant pain, certified therapy.  She is a 46 year old white female, G1, P1, who presents for definitive therapy of dysmenorrhea and definitive treatment of her left lower quadrant pain.  ALLERGIES:  She has allergies to SULFA and PENICILLIN.  SOCIAL HISTORY:  Nonsmoker, nondrinker.  She denies domestic or physical violence.  Birth control is vasectomy.  FAMILY HISTORY:  Breast cancer, myocardial infarction, diabetes, heart disease, leukemia.  History of vaginal delivery __________.  MEDICATIONS:  Tylenol, Aleve p.r.n., Benadryl p.r.n.  PHYSICAL EXAMINATION:  GENERAL:  She is a well-developed, well-nourished white female, in no acute distress. VITAL SIGNS:  Blood pressure 104/72, weight is 151 pounds, height is 57 inches. HEENT:  Normal. NECK:  Supple.  Full range of motion. LUNGS:  Clear. HEART:  Regular rhythm. ABDOMEN:  Soft, nontender. PELVIC:  Uterus is retroverted.  No adnexal mass is appreciated. EXTREMITIES:  There are no cords. NEUROLOGIC:  Nonfocal. SKIN:  Intact.  IMPRESSION: 1. Refractory dysmenorrhea. 2. Left lower quadrant pain.  PLAN:  Da Vinci assisted left salpingo-oophorectomy and right salpingectomy.  Risks of anesthesia, infection, bleeding, injury to abdominal organs, need for repair was discussed, delayed versus immediate complications to include bowel and bladder injury noted.  The patient acknowledges and wishes to proceed.     Lenoard Aden, M.D.     RJT/MEDQ  D:  02/25/2012  T:  02/25/2012  Job:  [id number]

## 2012-02-26 ENCOUNTER — Encounter (HOSPITAL_COMMUNITY): Payer: Self-pay | Admitting: *Deleted

## 2012-02-26 ENCOUNTER — Encounter (HOSPITAL_COMMUNITY): Payer: Self-pay | Admitting: Certified Registered"

## 2012-02-26 ENCOUNTER — Ambulatory Visit (HOSPITAL_COMMUNITY): Payer: BC Managed Care – PPO | Admitting: Certified Registered"

## 2012-02-26 ENCOUNTER — Encounter (HOSPITAL_COMMUNITY)
Admission: RE | Disposition: A | Discharge status: Home / Self Care | Payer: Self-pay | Source: Ambulatory Visit | Attending: Obstetrics and Gynecology

## 2012-02-26 ENCOUNTER — Ambulatory Visit (HOSPITAL_COMMUNITY)
Admission: RE | Admit: 2012-02-26 | Discharge: 2012-02-27 | Disposition: A | Discharge status: Home / Self Care | Payer: BC Managed Care – PPO | Source: Ambulatory Visit | Attending: Obstetrics and Gynecology | Admitting: Obstetrics and Gynecology

## 2012-02-26 DIAGNOSIS — N803 Endometriosis of pelvic peritoneum, unspecified: Secondary | ICD-10-CM | POA: Insufficient documentation

## 2012-02-26 DIAGNOSIS — N815 Vaginal enterocele: Secondary | ICD-10-CM | POA: Insufficient documentation

## 2012-02-26 DIAGNOSIS — N8000 Endometriosis of the uterus, unspecified: Secondary | ICD-10-CM | POA: Insufficient documentation

## 2012-02-26 DIAGNOSIS — R1032 Left lower quadrant pain: Secondary | ICD-10-CM | POA: Insufficient documentation

## 2012-02-26 DIAGNOSIS — Z90710 Acquired absence of both cervix and uterus: Secondary | ICD-10-CM | POA: Diagnosis not present

## 2012-02-26 DIAGNOSIS — N920 Excessive and frequent menstruation with regular cycle: Secondary | ICD-10-CM | POA: Insufficient documentation

## 2012-02-26 HISTORY — PX: ROBOTIC ASSISTED SALPINGO OOPHERECTOMY: SHX6082

## 2012-02-26 HISTORY — DX: Acquired absence of both cervix and uterus: Z90.710

## 2012-02-26 HISTORY — PX: ROBOTIC ASSISTED TOTAL HYSTERECTOMY: SHX6085

## 2012-02-26 HISTORY — PX: LAPAROSCOPIC UNILATERAL SALPINGECTOMY: SHX5934

## 2012-02-26 SURGERY — ROBOTIC ASSISTED TOTAL HYSTERECTOMY
Anesthesia: General | Site: Abdomen | Laterality: Right | Wound class: Clean Contaminated

## 2012-02-26 MED ORDER — MIDAZOLAM HCL 2 MG/2ML IJ SOLN
INTRAMUSCULAR | Status: AC
  Filled 2012-02-26: qty 2

## 2012-02-26 MED ORDER — DEXAMETHASONE SODIUM PHOSPHATE 4 MG/ML IJ SOLN
INTRAMUSCULAR | Status: DC | PRN
  Administered 2012-02-26: 4 mg via INTRAVENOUS

## 2012-02-26 MED ORDER — GLYCOPYRROLATE 0.2 MG/ML IJ SOLN
INTRAMUSCULAR | Status: DC | PRN
  Administered 2012-02-26 (×2): 0.1 mg via INTRAVENOUS

## 2012-02-26 MED ORDER — MEPERIDINE HCL 25 MG/ML IJ SOLN: 6.2500 mg | INTRAMUSCULAR | Status: DC | PRN

## 2012-02-26 MED ORDER — PROPOFOL 10 MG/ML IV EMUL
INTRAVENOUS | Status: DC | PRN
  Administered 2012-02-26: 180 mg via INTRAVENOUS

## 2012-02-26 MED ORDER — SCOPOLAMINE 1 MG/3DAYS TD PT72
MEDICATED_PATCH | TRANSDERMAL | Status: AC
  Administered 2012-02-26: 1.5 mg via TRANSDERMAL
  Filled 2012-02-26: qty 1

## 2012-02-26 MED ORDER — DEXTROSE IN LACTATED RINGERS 5 % IV SOLN
INTRAVENOUS | Status: DC
  Administered 2012-02-26: 21:00:00 via INTRAVENOUS

## 2012-02-26 MED ORDER — DIPHENHYDRAMINE HCL 50 MG/ML IJ SOLN: 12.5000 mg | Freq: Four times a day (QID) | INTRAMUSCULAR | Status: DC | PRN

## 2012-02-26 MED ORDER — SCOPOLAMINE 1 MG/3DAYS TD PT72
1.0000 | MEDICATED_PATCH | Freq: Once | TRANSDERMAL | Status: DC
  Filled 2012-02-26: qty 1

## 2012-02-26 MED ORDER — LACTATED RINGERS IR SOLN
Status: DC | PRN
  Administered 2012-02-26: 3000 mL

## 2012-02-26 MED ORDER — FENTANYL CITRATE 0.05 MG/ML IJ SOLN
INTRAMUSCULAR | Status: AC
  Filled 2012-02-26: qty 5

## 2012-02-26 MED ORDER — GLYCOPYRROLATE 0.2 MG/ML IJ SOLN
INTRAMUSCULAR | Status: AC
  Filled 2012-02-26: qty 3

## 2012-02-26 MED ORDER — HYDROMORPHONE 0.3 MG/ML IV SOLN
INTRAVENOUS | Status: DC
  Administered 2012-02-26: 0.599 mg via INTRAVENOUS
  Administered 2012-02-26: 18:00:00 via INTRAVENOUS
  Filled 2012-02-26: qty 25

## 2012-02-26 MED ORDER — ROCURONIUM BROMIDE 100 MG/10ML IV SOLN
INTRAVENOUS | Status: DC | PRN
  Administered 2012-02-26 (×2): 10 mg via INTRAVENOUS
  Administered 2012-02-26: 50 mg via INTRAVENOUS

## 2012-02-26 MED ORDER — MIDAZOLAM HCL 5 MG/5ML IJ SOLN
INTRAMUSCULAR | Status: DC | PRN
  Administered 2012-02-26: 2 mg via INTRAVENOUS

## 2012-02-26 MED ORDER — FENTANYL CITRATE 0.05 MG/ML IJ SOLN
INTRAMUSCULAR | Status: AC
  Administered 2012-02-26: 50 ug via INTRAVENOUS
  Filled 2012-02-26: qty 2

## 2012-02-26 MED ORDER — SCOPOLAMINE 1 MG/3DAYS TD PT72
1.0000 | MEDICATED_PATCH | TRANSDERMAL | Status: DC
  Administered 2012-02-26: 1.5 mg via TRANSDERMAL

## 2012-02-26 MED ORDER — NALOXONE HCL 0.4 MG/ML IJ SOLN: 0.4000 mg | INTRAMUSCULAR | Status: DC | PRN

## 2012-02-26 MED ORDER — BUPIVACAINE HCL (PF) 0.25 % IJ SOLN
INTRAMUSCULAR | Status: DC | PRN
  Administered 2012-02-26: 10 mL

## 2012-02-26 MED ORDER — BUPIVACAINE HCL (PF) 0.25 % IJ SOLN
INTRAMUSCULAR | Status: AC
  Filled 2012-02-26: qty 30

## 2012-02-26 MED ORDER — PHENYLEPHRINE 40 MCG/ML (10ML) SYRINGE FOR IV PUSH (FOR BLOOD PRESSURE SUPPORT)
PREFILLED_SYRINGE | INTRAVENOUS | Status: AC
  Filled 2012-02-26: qty 5

## 2012-02-26 MED ORDER — OXYCODONE-ACETAMINOPHEN 5-325 MG PO TABS: 1.0000 | ORAL_TABLET | ORAL | Status: DC | PRN

## 2012-02-26 MED ORDER — NEOSTIGMINE METHYLSULFATE 1 MG/ML IJ SOLN
INTRAMUSCULAR | Status: AC
  Filled 2012-02-26: qty 10

## 2012-02-26 MED ORDER — TRAMADOL HCL 50 MG PO TABS: 50.0000 mg | ORAL_TABLET | Freq: Four times a day (QID) | ORAL | Status: DC | PRN

## 2012-02-26 MED ORDER — PHENYLEPHRINE HCL 10 MG/ML IJ SOLN
INTRAMUSCULAR | Status: DC | PRN
  Administered 2012-02-26: .04 mg via INTRAVENOUS

## 2012-02-26 MED ORDER — FENTANYL CITRATE 0.05 MG/ML IJ SOLN
INTRAMUSCULAR | Status: DC | PRN
  Administered 2012-02-26 (×2): 50 ug via INTRAVENOUS
  Administered 2012-02-26: 150 ug via INTRAVENOUS
  Administered 2012-02-26 (×2): 50 ug via INTRAVENOUS

## 2012-02-26 MED ORDER — LACTATED RINGERS IV SOLN
INTRAVENOUS | Status: DC
  Administered 2012-02-26 (×4): via INTRAVENOUS

## 2012-02-26 MED ORDER — PROPOFOL 10 MG/ML IV EMUL
INTRAVENOUS | Status: AC
  Filled 2012-02-26: qty 20

## 2012-02-26 MED ORDER — ZOLPIDEM TARTRATE 5 MG PO TABS: 5.0000 mg | ORAL_TABLET | Freq: Every evening | ORAL | Status: DC | PRN

## 2012-02-26 MED ORDER — ONDANSETRON HCL 4 MG/2ML IJ SOLN
INTRAMUSCULAR | Status: AC
  Filled 2012-02-26: qty 2

## 2012-02-26 MED ORDER — LIDOCAINE HCL (CARDIAC) 20 MG/ML IV SOLN
INTRAVENOUS | Status: DC | PRN
  Administered 2012-02-26: 50 mg via INTRAVENOUS

## 2012-02-26 MED ORDER — LIDOCAINE HCL (CARDIAC) 20 MG/ML IV SOLN
INTRAVENOUS | Status: AC
  Filled 2012-02-26: qty 5

## 2012-02-26 MED ORDER — CEFAZOLIN SODIUM-DEXTROSE 2-3 GM-% IV SOLR
INTRAVENOUS | Status: AC
  Filled 2012-02-26: qty 50

## 2012-02-26 MED ORDER — DIPHENHYDRAMINE HCL 12.5 MG/5ML PO ELIX: 12.5000 mg | ORAL_SOLUTION | Freq: Four times a day (QID) | ORAL | Status: DC | PRN

## 2012-02-26 MED ORDER — FENTANYL CITRATE 0.05 MG/ML IJ SOLN
INTRAMUSCULAR | Status: AC
  Filled 2012-02-26: qty 2

## 2012-02-26 MED ORDER — KETOROLAC TROMETHAMINE 30 MG/ML IJ SOLN
INTRAMUSCULAR | Status: AC
  Filled 2012-02-26: qty 1

## 2012-02-26 MED ORDER — SODIUM CHLORIDE 0.9 % IJ SOLN: 9.0000 mL | INTRAMUSCULAR | Status: DC | PRN

## 2012-02-26 MED ORDER — LACTATED RINGERS IV SOLN
INTRAVENOUS | Status: DC
  Administered 2012-02-26: 12:00:00 via INTRAVENOUS

## 2012-02-26 MED ORDER — METOCLOPRAMIDE HCL 5 MG/ML IJ SOLN: 10.0000 mg | Freq: Once | INTRAMUSCULAR | Status: DC | PRN

## 2012-02-26 MED ORDER — ONDANSETRON HCL 4 MG/2ML IJ SOLN: 4.0000 mg | Freq: Four times a day (QID) | INTRAMUSCULAR | Status: DC | PRN

## 2012-02-26 MED ORDER — FENTANYL CITRATE 0.05 MG/ML IJ SOLN
25.0000 ug | INTRAMUSCULAR | Status: DC | PRN
  Administered 2012-02-26: 50 ug via INTRAVENOUS

## 2012-02-26 SURGICAL SUPPLY — 68 items
BAG URINE DRAINAGE (UROLOGICAL SUPPLIES) ×4 IMPLANT
BARRIER ADHS 3X4 INTERCEED (GAUZE/BANDAGES/DRESSINGS) ×4 IMPLANT
CABLE HIGH FREQUENCY MONO STRZ (ELECTRODE) ×4 IMPLANT
CATH FOLEY 3WAY  5CC 16FR (CATHETERS) ×1
CATH FOLEY 3WAY 5CC 16FR (CATHETERS) ×3 IMPLANT
CHLORAPREP W/TINT 26ML (MISCELLANEOUS) ×4 IMPLANT
CLOTH BEACON ORANGE TIMEOUT ST (SAFETY) ×4 IMPLANT
CONT PATH 16OZ SNAP LID 3702 (MISCELLANEOUS) ×4 IMPLANT
COVER MAYO STAND STRL (DRAPES) ×4 IMPLANT
COVER TABLE BACK 60X90 (DRAPES) ×8 IMPLANT
COVER TIP SHEARS 8 DVNC (MISCELLANEOUS) ×3 IMPLANT
COVER TIP SHEARS 8MM DA VINCI (MISCELLANEOUS) ×1
DECANTER SPIKE VIAL GLASS SM (MISCELLANEOUS) ×4 IMPLANT
DERMABOND ADVANCED (GAUZE/BANDAGES/DRESSINGS) ×1
DERMABOND ADVANCED .7 DNX12 (GAUZE/BANDAGES/DRESSINGS) ×3 IMPLANT
DRAPE HUG U DISPOSABLE (DRAPE) ×4 IMPLANT
DRAPE LG THREE QUARTER DISP (DRAPES) ×8 IMPLANT
DRAPE WARM FLUID 44X44 (DRAPE) ×4 IMPLANT
ELECT REM PT RETURN 9FT ADLT (ELECTROSURGICAL) ×4
ELECTRODE REM PT RTRN 9FT ADLT (ELECTROSURGICAL) ×3 IMPLANT
EVACUATOR SMOKE 8.L (FILTER) ×4 IMPLANT
GAUZE VASELINE 3X9 (GAUZE/BANDAGES/DRESSINGS) IMPLANT
GLOVE BIO SURGEON STRL SZ7.5 (GLOVE) ×12 IMPLANT
GOWN STRL REIN XL XLG (GOWN DISPOSABLE) ×24 IMPLANT
GYRUS RUMI II 2.5CM BLUE (DISPOSABLE)
GYRUS RUMI II 3.5CM BLUE (DISPOSABLE) ×4
GYRUS RUMI II 4.0CM BLUE (DISPOSABLE)
KIT ACCESSORY DA VINCI DISP (KITS) ×1
KIT ACCESSORY DVNC DISP (KITS) ×3 IMPLANT
LEGGING LITHOTOMY PAIR STRL (DRAPES) ×4 IMPLANT
NEEDLE INSUFFLATION 14GA 120MM (NEEDLE) ×4 IMPLANT
PACK LAVH (CUSTOM PROCEDURE TRAY) ×4 IMPLANT
PAD PREP 24X48 CUFFED NSTRL (MISCELLANEOUS) ×8 IMPLANT
PLUG CATH AND CAP STER (CATHETERS) ×4 IMPLANT
PROTECTOR NERVE ULNAR (MISCELLANEOUS) ×8 IMPLANT
RUMI II 3.0CM BLUE KOH-EFFICIE (DISPOSABLE) IMPLANT
RUMI II GYRUS 2.5CM BLUE (DISPOSABLE) IMPLANT
RUMI II GYRUS 3.5CM BLUE (DISPOSABLE) ×3 IMPLANT
RUMI II GYRUS 4.0CM BLUE (DISPOSABLE) IMPLANT
SET CYSTO W/LG BORE CLAMP LF (SET/KITS/TRAYS/PACK) IMPLANT
SET IRRIG TUBING LAPAROSCOPIC (IRRIGATION / IRRIGATOR) ×4 IMPLANT
SOLUTION ELECTROLUBE (MISCELLANEOUS) ×4 IMPLANT
SUT VIC AB 0 CT1 27 (SUTURE) ×4
SUT VIC AB 0 CT1 27XBRD ANBCTR (SUTURE) ×6 IMPLANT
SUT VIC AB 0 CT1 27XBRD ANTBC (SUTURE) IMPLANT
SUT VICRYL 0 UR6 27IN ABS (SUTURE) ×4 IMPLANT
SUT VICRYL 4-0 PS2 18IN ABS (SUTURE) ×8 IMPLANT
SUT VICRYL RAPIDE 4/0 PS 2 (SUTURE) IMPLANT
SUT VLOC 180 0 9IN  GS21 (SUTURE)
SUT VLOC 180 0 9IN GS21 (SUTURE) IMPLANT
SYR 50ML LL SCALE MARK (SYRINGE) ×4 IMPLANT
SYRINGE 10CC LL (SYRINGE) ×4 IMPLANT
SYSTEM CONVERTIBLE TROCAR (TROCAR) IMPLANT
TIP RUMI ORANGE 6.7MMX12CM (TIP) IMPLANT
TIP UTERINE 5.1X6CM LAV DISP (MISCELLANEOUS) IMPLANT
TIP UTERINE 6.7X10CM GRN DISP (MISCELLANEOUS) ×4 IMPLANT
TIP UTERINE 6.7X6CM WHT DISP (MISCELLANEOUS) IMPLANT
TIP UTERINE 6.7X8CM BLUE DISP (MISCELLANEOUS) IMPLANT
TOWEL OR 17X24 6PK STRL BLUE (TOWEL DISPOSABLE) ×12 IMPLANT
TROCAR DISP BLADELESS 8 DVNC (TROCAR) ×3 IMPLANT
TROCAR DISP BLADELESS 8MM (TROCAR) ×1
TROCAR XCEL 12X100 BLDLESS (ENDOMECHANICALS) ×4 IMPLANT
TROCAR XCEL NON-BLD 5MMX100MML (ENDOMECHANICALS) ×4 IMPLANT
TROCAR Z-THREAD 12X150 (TROCAR) ×4 IMPLANT
TROCAR Z-THREAD FIOS 12X100MM (TROCAR) IMPLANT
TUBING FILTER THERMOFLATOR (ELECTROSURGICAL) ×4 IMPLANT
WARMER LAPAROSCOPE (MISCELLANEOUS) ×4 IMPLANT
WATER STERILE IRR 1000ML POUR (IV SOLUTION) ×12 IMPLANT

## 2012-02-26 NOTE — Anesthesia Preprocedure Evaluation (Addendum)
Anesthesia Evaluation  Patient identified by MRN, date of birth, ID band Patient awake    Reviewed: Allergy & Precautions, H&P , NPO status , Patient's Chart, lab work & pertinent test results  History of Anesthesia Complications (+) Family history of anesthesia reaction  Airway       Dental   Pulmonary neg pulmonary ROS,          Cardiovascular negative cardio ROS      Neuro/Psych  Headaches, negative psych ROS   GI/Hepatic negative GI ROS, Neg liver ROS,   Endo/Other    Renal/GU negative Renal ROS  negative genitourinary   Musculoskeletal negative musculoskeletal ROS (+)   Abdominal   Peds  Hematology negative hematology ROS (+)   Anesthesia Other Findings   Reproductive/Obstetrics Menorrhagia Pelvic Pain                           Anesthesia Physical Anesthesia Plan  ASA: I  Anesthesia Plan: General   Post-op Pain Management:    Induction: Intravenous  Airway Management Planned: Oral ETT  Additional Equipment:   Intra-op Plan:   Post-operative Plan: Extubation in OR  Informed Consent: I have reviewed the patients History and Physical, chart, labs and discussed the procedure including the risks, benefits and alternatives for the proposed anesthesia with the patient or authorized representative who has indicated his/her understanding and acceptance.   Dental advisory given  Plan Discussed with: CRNA, Anesthesiologist and Surgeon  Anesthesia Plan Comments:         Anesthesia Quick Evaluation

## 2012-02-26 NOTE — Preoperative (Signed)
Beta Blockers   Reason not to administer Beta Blockers:Not Applicable 

## 2012-02-26 NOTE — Transfer of Care (Signed)
Immediate Anesthesia Transfer of Care Note  Patient: Alicia Robles  Procedure(s) Performed: Procedure(s) (LRB) with comments: ROBOTIC ASSISTED TOTAL HYSTERECTOMY (N/A) - 3 hrs. ROBOTIC ASSISTED SALPINGO OOPHORECTOMY (Left) LAPAROSCOPIC UNILATERAL SALPINGECTOMY (Right)  Patient Location: PACU  Anesthesia Type:General  Level of Consciousness: awake, alert , oriented and patient cooperative  Airway & Oxygen Therapy: Patient Spontanous Breathing and Patient connected to nasal cannula oxygen  Post-op Assessment: Report given to PACU RN and Post -op Vital signs reviewed and stable  Post vital signs: Reviewed and stable  Complications: No apparent anesthesia complications

## 2012-02-26 NOTE — Anesthesia Postprocedure Evaluation (Signed)
Anesthesia Post Note  Patient: Alicia Robles  Procedure(s) Performed: Procedure(s) (LRB): ROBOTIC ASSISTED TOTAL HYSTERECTOMY (N/A) ROBOTIC ASSISTED SALPINGO OOPHORECTOMY (Left) LAPAROSCOPIC UNILATERAL SALPINGECTOMY (Right)  Anesthesia type: General  Patient location: PACU  Post pain: Pain level controlled  Post assessment: Post-op Vital signs reviewed  Last Vitals:  Filed Vitals:   02/26/12 1708  BP:   Pulse: 70  Temp:   Resp: 18    Post vital signs: Reviewed  Level of consciousness: sedated  Complications: No apparent anesthesia complicationsfj

## 2012-02-26 NOTE — Progress Notes (Signed)
Patient ID: Alicia Robles, female   DOB: [birth year], 46 y.o.   MRN: [id number] Patient seen and examined. Consent witnessed and signed. No changes noted. Update completed.

## 2012-02-26 NOTE — Op Note (Signed)
02/26/2012  4:28 PM  PATIENT:  Alicia Robles  46 y.o. female  PRE-OPERATIVE DIAGNOSIS:  Menorrhagia, Left lower quadrant pain  POST-OPERATIVE DIAGNOSIS:  menorrhagia,left lower quadrant pain Enterocele, Pelvic Endometriosis  PROCEDURE:  Procedure(s): ROBOTIC ASSISTED TOTAL HYSTERECTOMY ROBOTIC ASSISTED SALPINGO OOPHORECTOMY LAPAROSCOPIC UNILATERAL SALPINGECTOMY ABLATION OF CUL DE SAC ENDOMETRIOSIS MCCALL CUL DE PLASTY  SURGEON:  Surgeon(s): Lenoard Aden, MD  ASSISTANTS: Renae Fickle, CNM   ANESTHESIA:   local and general  ESTIMATED BLOOD LOSS: * No blood loss amount entered *   DRAINS: Urinary Catheter (Foley)   LOCAL MEDICATIONS USED:  MARCAINE     SPECIMEN:  Source of Specimen:  uterus, cervix, bilateral tubes, left ovary  DISPOSITION OF SPECIMEN:  PATHOLOGY  COUNTS:  YES  DICTATION #: [id number]  PLAN OF CARE: DC home in , 23 hr observation  PATIENT DISPOSITION:  PACU - hemodynamically stable.

## 2012-02-27 ENCOUNTER — Encounter (HOSPITAL_COMMUNITY): Payer: Self-pay

## 2012-02-27 DIAGNOSIS — Z90710 Acquired absence of both cervix and uterus: Secondary | ICD-10-CM

## 2012-02-27 HISTORY — DX: Acquired absence of both cervix and uterus: Z90.710

## 2012-02-27 LAB — CBC
Hemoglobin: 12.3 g/dL (ref 12.0–15.0)
MCH: 30.1 pg (ref 26.0–34.0)
MCHC: 33.7 g/dL (ref 30.0–36.0)
MCV: 89.5 fL (ref 78.0–100.0)
RBC: 4.08 MIL/uL (ref 3.87–5.11)

## 2012-02-27 MED ORDER — OXYCODONE-ACETAMINOPHEN 5-325 MG PO TABS: 1.0000 | ORAL_TABLET | ORAL | Status: DC | PRN

## 2012-02-27 MED ORDER — TRAMADOL HCL 50 MG PO TABS: 50.0000 mg | ORAL_TABLET | Freq: Four times a day (QID) | ORAL | Status: AC | PRN

## 2012-02-27 NOTE — Op Note (Signed)
NAME:  Alicia Robles, LADOUCEUR                ACCOUNT NO.:  [id number]  MEDICAL RECORD NO.:  [id number]  LOCATION:  9302                          FACILITY:  [facility]  PHYSICIAN:  Lenoard Aden, M.D.DATE OF BIRTH:  [birth year]  DATE OF PROCEDURE:  02/26/2012 DATE OF DISCHARGE:                              OPERATIVE REPORT   DESCRIPTION OF PROCEDURE:  After being apprised of the risks of anesthesia, infection, bleeding, injury to abdominal organs, need for repair, delayed versus immediate complications include bowel and bladder injury, possible need for repair, the patient was brought to the operating room, where she was administered a general anesthetic without complications.  Prepped and draped in usual sterile fashion.  Foley catheter placed.  RUMI retractor placed per vagina.  Retroverted uterus with bilateral normal adnexa on exam.  At this time, after placement of the RUMI retractor, infraumbilical incision made with a scalpel.  Veress needle placed, opening pressure -4 noted, 3 L of CO2 insufflated without difficulty.  At this time atraumatic placement of 12 mm trocar in the midline, which was placed without difficulty.  Visualization revealed normal liver gallbladder bed, normal appendiceal area, atraumatic trocar entry, a normal sized uterus, normal ovaries and evidence of pelvic endometriosis along the left peritoneal sidewall.  At this time, the robotic ports were placed, 2 on the right, 1 on the left, standard fashion, 5 mm port on the left.  All trocars placed atraumatically. Deep Trendelenburg position was established and the robot was docked in a standard fashion with placement of the ProGrasp, endo-shears, and PK forceps.  At this time, the robotic portion of procedure was initiated whereby the left adnexa was addressed.  The retroperitoneal space was opened between the left tube, left round ligament, and left psoas muscle.  The retroperitoneal space was entered.  Ureter was  identified. The infundibulopelvic ligament was skeletonized, cauterized in a 3-point method and divided.  There was endometriosis along the left side wall which was excised with the specimen after dissecting the ureter off the medial leaf of the peritoneum.  This was included in the specimen with ablation of some surface endometriosis in the left uterosacral area and cul-de-sac.  The round ligament was then divided on the left.  The uterine vessels skeletonized.  The bladder flap was developed sharply. At this time, attention was turned to the right whereby the mesosalpinx was cauterized and divided.  The retroperitoneal space was entered.  The tubo-ovarian ligament was cauterized and the ovary was detached.  No endometriosis was seen on the right.  The ureter was identified, dissected off the medial leaf of the peritoneum into a caudal fashion atraumatically.  At this time, the round ligament on the right was divided and the bladder flap was further developed skeletonizing the right uterine vessel as well.  Bilaterally the uterine vessels were cauterized in a 3-point method and then divided on the right.  They were then divided on the left.  The bladder flap was sharply dissected with exposure of the bulging of the RUMI cup circumferentially 360 degrees. This area was cauterized detaching the specimen which was then retracted into the vagina.  Enterocele was identified.  Hemostasis was achieved after  retraction of the specimen into the vagina.  The cuff was then closed using 0 V-Loc suture in a continuous running fashion.  A second imbricating layer was placed.  A McCall culdoplasty suture was also performed using the V-Loc suture.  The ureter was identified being peristalsing bilaterally.  The right ovary appears normal.  Irrigation was accomplished.  All instruments were then removed.  The robot was undocked.  Reinspection reveals hemostatic pedicles.  All instruments were removed  from the abdomen.  Incisions were closed using 4-0 Vicryl, 0 Vicryl, and Dermabond.  Vaginal exam revealed the cuff to be well approximated with no evidence of dehiscence.  Urine output was copious and urine was clear.  The patient tolerated the procedure well, was awakened, and transferred to recovery in good condition.     Lenoard Aden, M.D.     RJT/MEDQ  D:  02/26/2012  T:  02/27/2012  Job:  [id number]

## 2012-02-27 NOTE — Anesthesia Postprocedure Evaluation (Signed)
  Anesthesia Post-op Note  Patient: Alicia Robles  Procedure(s) Performed: Procedure(s) (LRB) with comments: ROBOTIC ASSISTED TOTAL HYSTERECTOMY (N/A) - 3 hrs. ROBOTIC ASSISTED SALPINGO OOPHORECTOMY (Left) LAPAROSCOPIC UNILATERAL SALPINGECTOMY (Right)  Patient Location: Mother/Baby  Anesthesia Type:General  Level of Consciousness: awake, alert , oriented and patient cooperative  Airway and Oxygen Therapy: Patient Spontanous Breathing  Post-op Pain: none  Post-op Assessment: Post-op Vital signs reviewed and Patient's Cardiovascular Status Stable  Post-op Vital Signs: Reviewed and stable  Complications: No apparent anesthesia complications

## 2012-02-27 NOTE — Addendum Note (Signed)
Addendum  created 02/27/12 1041 by Orlie Pollen, CRNA   Modules edited:Notes Section

## 2012-02-27 NOTE — Progress Notes (Signed)
1 Day Post-Op Procedure(s) (LRB): ROBOTIC ASSISTED TOTAL HYSTERECTOMY (N/A) ROBOTIC ASSISTED SALPINGO OOPHORECTOMY (Left) LAPAROSCOPIC UNILATERAL SALPINGECTOMY (Right)  Subjective: Patient reports nausea, incisional pain, tolerating PO, + flatus and no problems voiding.    Objective: I have reviewed patient's vital signs, intake and output and labs.  General: alert, cooperative and appears stated age Resp: clear to auscultation bilaterally and normal percussion bilaterally Cardio: normal apical impulse, S1, S2 normal and no rub GI: soft, non-tender; bowel sounds normal; no masses,  no organomegaly and incision: clean, dry and intact Extremities: extremities normal, atraumatic, no cyanosis or edema, Homans sign is negative, no sign of DVT and no edema, redness or tenderness in the calves or thighs Vaginal Bleeding: minimal  Assessment: s/p Procedure(s) (LRB) with comments: ROBOTIC ASSISTED TOTAL HYSTERECTOMY (N/A) - 3 hrs. ROBOTIC ASSISTED SALPINGO OOPHORECTOMY (Left) LAPAROSCOPIC UNILATERAL SALPINGECTOMY (Right): stable, progressing well and tolerating diet  Plan: Advance diet Encourage ambulation Discontinue IV fluids Discharge home Percocet and Tramadol given Fu office 2 weeks  LOS: 1 day    Alicia Robles J 02/27/2012, 7:23 AM

## 2012-02-27 NOTE — Progress Notes (Signed)
Pt ambulated out  Teaching complete  Husband  With pt

## 2012-05-12 ENCOUNTER — Encounter: Payer: Self-pay | Admitting: Family Medicine

## 2012-05-12 ENCOUNTER — Ambulatory Visit (INDEPENDENT_AMBULATORY_CARE_PROVIDER_SITE_OTHER): Payer: BC Managed Care – PPO | Admitting: Family Medicine

## 2012-05-12 VITALS — BP 110/62 | HR 74 | Temp 97.5°F | Ht 67.75 in | Wt 153.0 lb

## 2012-05-12 DIAGNOSIS — H109 Unspecified conjunctivitis: Secondary | ICD-10-CM | POA: Insufficient documentation

## 2012-05-12 MED ORDER — CIPROFLOXACIN HCL 0.3 % OP SOLN: OPHTHALMIC | Status: AC

## 2012-05-12 NOTE — Patient Instructions (Addendum)
I think you have pink eye (conjunctivitis) which is viral  Wash linens and pillowcases and hands often  Don't touch eyes  Get rid of eye makeup and buy new when you are totally better Artificial tears otc are ok  Cool compresses relieve some symptoms Wipe away crust in am with warm wet washcloth  If worse - fill cipro drops and let me know  If vision change or swelling - let me know

## 2012-05-12 NOTE — Assessment & Plan Note (Signed)
Suspect viral but disc s/s to watch for re: bacterial conjunctivitis Disc symptomatic care - see instructions on AVS  hygeine discussed  Given px for cipro opthy to hold on to - and fill only if symptoms get much worse (will also call ) If worse or vision change adv to call as well

## 2012-05-12 NOTE — Progress Notes (Signed)
Subjective:    Patient ID: Alicia Robles, female    DOB: 22-Mar-1966, 47 y.o.   MRN: [id number]  HPI Here for eye symptoms - since last night   Itchy eyes last night - felt dry Watery/ discharge today - and pain in the right eye  A little bit of redness and lower lid feels swollen No trauma No fb in the eye   No uri symptoms   No known exp to pink eye  Patient Active Problem List  Diagnosis  . OVARIAN CYST  . MIGRAINES, HX OF  . HYPERCHOLESTEROLEMIA  . PELVIC  PAIN  . Routine general medical examination at a health care facility  . S/P hysterectomy - [facility] robot   Past Medical History  Diagnosis Date  . Other and unspecified ovarian cyst   . Migraines   . Family history of anesthesia complication     PONV  . No pertinent past medical history   . S/P hysterectomy - [facility] robot 02/27/2012   Past Surgical History  Procedure Date  . None   . No past surgeries   . Robotic assisted total hysterectomy 02/26/2012    Procedure: ROBOTIC ASSISTED TOTAL HYSTERECTOMY;  Surgeon: Lenoard Aden, MD;  Location: [facility];  Service: Gynecology;  Laterality: N/A;  3 hrs.  . Robotic assisted salpingo oopherectomy 02/26/2012    Procedure: ROBOTIC ASSISTED SALPINGO OOPHORECTOMY;  Surgeon: Lenoard Aden, MD;  Location: [facility];  Service: Gynecology;  Laterality: Left;  . Laparoscopic unilateral salpingectomy 02/26/2012    Procedure: LAPAROSCOPIC UNILATERAL SALPINGECTOMY;  Surgeon: Lenoard Aden, MD;  Location: [facility];  Service: Gynecology;  Laterality: Right;   History  Substance Use Topics  . Smoking status: Never Smoker   . Smokeless tobacco: Never Used  . Alcohol Use: No   Family History  Problem Relation Age of Onset  . Colon cancer Neg Hx   . Breast cancer Mother   . Heart disease Maternal Aunt   . Heart disease Maternal Uncle   . Breast cancer Maternal Aunt   . Diabetes Maternal Uncle    Allergies  Allergen Reactions  . Sulfonamide Derivatives Anaphylaxis  .  Penicillins Other (See Comments)    Childhood reaction   Current Outpatient Prescriptions on File Prior to Visit  Medication Sig Dispense Refill  . acetaminophen (TYLENOL) 325 MG tablet Take 650 mg by mouth every 6 (six) hours as needed. For headache      . diphenhydrAMINE (BENADRYL ALLERGY) 25 mg capsule Take 25 mg by mouth at bedtime as needed. For sleep      . Multiple Vitamins-Minerals (ADULT GUMMY PO) Take 1 tablet by mouth daily.       . traMADol (ULTRAM) 50 MG tablet Take 1-2 tablets (50-100 mg total) by mouth every 6 (six) hours as needed for pain.  30 tablet  0      Review of Systems Review of Systems  Constitutional: Negative for fever, appetite change, fatigue and unexpected weight change.  Eyes: Negative for vis dist/ pos for itching/ soreness/ discharge ENT neg for rhinorrhea or sinus pain or facial redness or swelling  Respiratory: Negative for cough and shortness of breath.   Cardiovascular: Negative for cp or palpitations    Gastrointestinal: Negative for nausea, diarrhea and constipation.  Genitourinary: Negative for urgency and frequency.  Skin: Negative for pallor or rash   Neurological: Negative for weakness, light-headedness, numbness and headaches.  Hematological: Negative for adenopathy. Does not bruise/bleed easily.  Psychiatric/Behavioral: Negative for dysphoric  mood. The patient is not nervous/anxious.         Objective:   Physical Exam  Constitutional: She appears well-developed and well-nourished. No distress.  HENT:  Head: Normocephalic and atraumatic.  Right Ear: External ear normal.  Left Ear: External ear normal.  Nose: Nose normal.  Mouth/Throat: Oropharynx is clear and moist. No oropharyngeal exudate.       No facial tenderness or swelling   Eyes: EOM are normal. Pupils are equal, round, and reactive to light. Right eye exhibits discharge. No scleral icterus.       Mild conj injection of R eye- mostly medially  No swelling Clear tearing/  discharge noted Pt is wearing eye makeup  Neck: Normal range of motion. Neck supple.  Cardiovascular: Normal rate and regular rhythm.   Pulmonary/Chest: Effort normal and breath sounds normal.  Lymphadenopathy:    She has no cervical adenopathy.  Skin: Skin is warm and dry. No rash noted. No erythema.  Psychiatric: She has a normal mood and affect.          Assessment & Plan:

## 2012-05-14 ENCOUNTER — Other Ambulatory Visit: Payer: Self-pay | Admitting: Obstetrics and Gynecology

## 2012-05-14 DIAGNOSIS — Z1231 Encounter for screening mammogram for malignant neoplasm of breast: Secondary | ICD-10-CM

## 2012-06-05 ENCOUNTER — Ambulatory Visit
Admission: RE | Admit: 2012-06-05 | Discharge: 2012-06-05 | Disposition: A | Discharge status: Home / Self Care | Payer: BC Managed Care – PPO | Source: Ambulatory Visit | Attending: Obstetrics and Gynecology | Admitting: Obstetrics and Gynecology

## 2012-06-05 DIAGNOSIS — Z1231 Encounter for screening mammogram for malignant neoplasm of breast: Secondary | ICD-10-CM

## 2013-05-27 ENCOUNTER — Other Ambulatory Visit: Payer: Self-pay

## 2013-05-27 DIAGNOSIS — Z1231 Encounter for screening mammogram for malignant neoplasm of breast: Secondary | ICD-10-CM

## 2013-06-09 ENCOUNTER — Ambulatory Visit: Payer: BC Managed Care – PPO

## 2013-06-10 ENCOUNTER — Ambulatory Visit
Admission: RE | Admit: 2013-06-10 | Discharge: 2013-06-10 | Disposition: A | Discharge status: Home / Self Care | Payer: BC Managed Care – PPO | Source: Ambulatory Visit

## 2013-06-10 DIAGNOSIS — Z1231 Encounter for screening mammogram for malignant neoplasm of breast: Secondary | ICD-10-CM

## 2014-05-30 ENCOUNTER — Other Ambulatory Visit: Payer: Self-pay

## 2014-05-30 DIAGNOSIS — Z1231 Encounter for screening mammogram for malignant neoplasm of breast: Secondary | ICD-10-CM

## 2014-06-14 ENCOUNTER — Ambulatory Visit
Admission: RE | Admit: 2014-06-14 | Discharge: 2014-06-14 | Disposition: A | Discharge status: Home / Self Care | Payer: BLUE CROSS/BLUE SHIELD | Source: Ambulatory Visit

## 2014-06-14 DIAGNOSIS — Z1231 Encounter for screening mammogram for malignant neoplasm of breast: Secondary | ICD-10-CM

## 2014-12-05 ENCOUNTER — Ambulatory Visit (INDEPENDENT_AMBULATORY_CARE_PROVIDER_SITE_OTHER): Payer: BLUE CROSS/BLUE SHIELD | Admitting: Podiatry

## 2014-12-05 ENCOUNTER — Encounter: Payer: Self-pay | Admitting: Podiatry

## 2014-12-05 ENCOUNTER — Ambulatory Visit (INDEPENDENT_AMBULATORY_CARE_PROVIDER_SITE_OTHER): Payer: BLUE CROSS/BLUE SHIELD

## 2014-12-05 VITALS — BP 113/70 | HR 74 | Resp 18

## 2014-12-05 DIAGNOSIS — R52 Pain, unspecified: Secondary | ICD-10-CM

## 2014-12-05 DIAGNOSIS — B353 Tinea pedis: Secondary | ICD-10-CM

## 2014-12-05 DIAGNOSIS — L03119 Cellulitis of unspecified part of limb: Secondary | ICD-10-CM | POA: Diagnosis not present

## 2014-12-05 MED ORDER — TERBINAFINE HCL 250 MG PO TABS: 250.0000 mg | ORAL_TABLET | Freq: Every day | ORAL | Status: AC

## 2014-12-05 MED ORDER — DOXYCYCLINE HYCLATE 100 MG PO TABS: 100.0000 mg | ORAL_TABLET | Freq: Two times a day (BID) | ORAL | Status: AC

## 2014-12-05 NOTE — Progress Notes (Signed)
   Subjective:    Patient ID: Alicia Robles, female    DOB: 1965/07/29, 49 y.o.   MRN: [id number]  HPI  49 year old female presents the office today for concerns of swelling and redness to her right foot overlying her fifth toe just proximal to the area. She states it has been progressive over the last few days. She states that she first was the area on Saturday morning and is in her toe is a little red. On Sunday she says the area worsen. She was trying over the counter anti-fungals is also in Epson salt soaks. She was that she made been bit by block but she is unsure. Denies any history of injury or trauma. No other complaints     Review of Systems  All other systems reviewed and are negative.      Objective:   Physical Exam AAO x3, NAD DP/PT pulses palpable bilaterally, CRT less than 3 seconds Protective sensation intact with Simms Weinstein monofilament, vibratory sensation intact, Achilles tendon reflex intact On the dorsal lateral aspect of the right foot there is erythema on the 5th digit and extending just proximal to the 4th and 5th MTPJ. There is macerated skin in the 4th interspace on the right foot. There is no ascending cellulitis.There is mild tenderness over the area. There is no areas of fluctuace or crepitance. No maloder. There is also erythematous, dry skin in the interspace on the left foot. No areas of tenderness to bilateral lower extremities. MMT 5/5, ROM WNL.  No open lesions or pre-ulcerative lesions.  No overlying edema, erythema, increase in warmth to bilateral lower extremities.  No pain with calf compression, swelling, warmth, erythema bilaterally.      Assessment & Plan:  49 year-old female with likely tinea with overlying cellulitis.  -X-rays were obtained and reviewed with the patient.  -Treatment options discussed including all alternatives, risks, and complications -Discussed likely etiology of symptoms -Rx doxycyline and lamisil. Directed on side  effects of the medication and directed to call should any occur.  -Monitor for any clinical signs or symptoms of infection and directed to call the office immediately should any occur or go to the ER. -Follow-up 2 weeks  or sooner if any problems arise. In the meantime, encouraged to call the office with any questions, concerns, change in symptoms.   Ovid Curd, DPM

## 2015-07-18 ENCOUNTER — Other Ambulatory Visit: Payer: Self-pay

## 2015-07-18 DIAGNOSIS — Z1231 Encounter for screening mammogram for malignant neoplasm of breast: Secondary | ICD-10-CM

## 2015-07-18 DIAGNOSIS — Z803 Family history of malignant neoplasm of breast: Secondary | ICD-10-CM

## 2015-07-25 ENCOUNTER — Ambulatory Visit
Admission: RE | Admit: 2015-07-25 | Discharge: 2015-07-25 | Disposition: A | Discharge status: Home / Self Care | Payer: BLUE CROSS/BLUE SHIELD | Source: Ambulatory Visit

## 2015-07-25 DIAGNOSIS — Z803 Family history of malignant neoplasm of breast: Secondary | ICD-10-CM

## 2015-07-25 DIAGNOSIS — Z1231 Encounter for screening mammogram for malignant neoplasm of breast: Secondary | ICD-10-CM

## 2015-10-19 DIAGNOSIS — H16102 Unspecified superficial keratitis, left eye: Secondary | ICD-10-CM | POA: Diagnosis not present

## 2015-10-19 DIAGNOSIS — H04123 Dry eye syndrome of bilateral lacrimal glands: Secondary | ICD-10-CM | POA: Diagnosis not present

## 2015-10-19 DIAGNOSIS — H10413 Chronic giant papillary conjunctivitis, bilateral: Secondary | ICD-10-CM | POA: Diagnosis not present

## 2015-10-19 DIAGNOSIS — H1789 Other corneal scars and opacities: Secondary | ICD-10-CM | POA: Diagnosis not present

## 2015-10-23 DIAGNOSIS — L739 Follicular disorder, unspecified: Secondary | ICD-10-CM | POA: Diagnosis not present

## 2015-10-23 DIAGNOSIS — D485 Neoplasm of uncertain behavior of skin: Secondary | ICD-10-CM | POA: Diagnosis not present

## 2015-10-23 DIAGNOSIS — D2239 Melanocytic nevi of other parts of face: Secondary | ICD-10-CM | POA: Diagnosis not present

## 2016-01-17 DIAGNOSIS — Z23 Encounter for immunization: Secondary | ICD-10-CM | POA: Diagnosis not present

## 2016-07-08 ENCOUNTER — Other Ambulatory Visit: Payer: Self-pay | Admitting: Obstetrics and Gynecology

## 2016-07-08 DIAGNOSIS — Z1231 Encounter for screening mammogram for malignant neoplasm of breast: Secondary | ICD-10-CM

## 2016-07-23 ENCOUNTER — Ambulatory Visit
Admission: RE | Admit: 2016-07-23 | Discharge: 2016-07-23 | Disposition: A | Discharge status: Home / Self Care | Payer: BLUE CROSS/BLUE SHIELD | Source: Ambulatory Visit | Attending: Obstetrics and Gynecology | Admitting: Obstetrics and Gynecology

## 2016-07-23 DIAGNOSIS — Z1231 Encounter for screening mammogram for malignant neoplasm of breast: Secondary | ICD-10-CM | POA: Diagnosis not present

## 2016-08-13 DIAGNOSIS — L989 Disorder of the skin and subcutaneous tissue, unspecified: Secondary | ICD-10-CM | POA: Diagnosis not present

## 2016-08-13 DIAGNOSIS — Z808 Family history of malignant neoplasm of other organs or systems: Secondary | ICD-10-CM | POA: Diagnosis not present

## 2016-08-29 DIAGNOSIS — Z01419 Encounter for gynecological examination (general) (routine) without abnormal findings: Secondary | ICD-10-CM | POA: Diagnosis not present

## 2016-08-29 DIAGNOSIS — Z6824 Body mass index (BMI) 24.0-24.9, adult: Secondary | ICD-10-CM | POA: Diagnosis not present

## 2016-08-29 DIAGNOSIS — Z1151 Encounter for screening for human papillomavirus (HPV): Secondary | ICD-10-CM | POA: Diagnosis not present

## 2017-06-18 ENCOUNTER — Other Ambulatory Visit: Payer: Self-pay | Admitting: Obstetrics and Gynecology

## 2017-06-18 DIAGNOSIS — Z1231 Encounter for screening mammogram for malignant neoplasm of breast: Secondary | ICD-10-CM

## 2017-07-29 ENCOUNTER — Ambulatory Visit
Admission: RE | Admit: 2017-07-29 | Discharge: 2017-07-29 | Disposition: A | Discharge status: Home / Self Care | Payer: BLUE CROSS/BLUE SHIELD | Source: Ambulatory Visit | Attending: Obstetrics and Gynecology | Admitting: Obstetrics and Gynecology

## 2017-07-29 DIAGNOSIS — Z1231 Encounter for screening mammogram for malignant neoplasm of breast: Secondary | ICD-10-CM | POA: Diagnosis not present

## 2017-07-30 ENCOUNTER — Other Ambulatory Visit: Payer: Self-pay | Admitting: Obstetrics and Gynecology

## 2017-07-30 DIAGNOSIS — R928 Other abnormal and inconclusive findings on diagnostic imaging of breast: Secondary | ICD-10-CM

## 2017-08-04 ENCOUNTER — Ambulatory Visit
Admission: RE | Admit: 2017-08-04 | Discharge: 2017-08-04 | Disposition: A | Discharge status: Home / Self Care | Payer: BLUE CROSS/BLUE SHIELD | Source: Ambulatory Visit | Attending: Obstetrics and Gynecology | Admitting: Obstetrics and Gynecology

## 2017-08-04 DIAGNOSIS — R928 Other abnormal and inconclusive findings on diagnostic imaging of breast: Secondary | ICD-10-CM

## 2017-08-04 DIAGNOSIS — R922 Inconclusive mammogram: Secondary | ICD-10-CM | POA: Diagnosis not present

## 2017-08-04 DIAGNOSIS — N6001 Solitary cyst of right breast: Secondary | ICD-10-CM | POA: Diagnosis not present

## 2017-11-04 DIAGNOSIS — S46012A Strain of muscle(s) and tendon(s) of the rotator cuff of left shoulder, initial encounter: Secondary | ICD-10-CM | POA: Diagnosis not present

## 2017-11-04 DIAGNOSIS — M25512 Pain in left shoulder: Secondary | ICD-10-CM | POA: Diagnosis not present

## 2018-09-29 ENCOUNTER — Other Ambulatory Visit: Payer: Self-pay | Admitting: Obstetrics and Gynecology

## 2018-09-29 DIAGNOSIS — Z1231 Encounter for screening mammogram for malignant neoplasm of breast: Secondary | ICD-10-CM

## 2018-11-11 ENCOUNTER — Ambulatory Visit
Admission: RE | Admit: 2018-11-11 | Discharge: 2018-11-11 | Disposition: A | Discharge status: Home / Self Care | Payer: BLUE CROSS/BLUE SHIELD | Source: Ambulatory Visit | Attending: Obstetrics and Gynecology | Admitting: Obstetrics and Gynecology

## 2018-11-11 ENCOUNTER — Other Ambulatory Visit: Payer: Self-pay

## 2018-11-11 DIAGNOSIS — Z1231 Encounter for screening mammogram for malignant neoplasm of breast: Secondary | ICD-10-CM

## 2019-06-19 ENCOUNTER — Ambulatory Visit: Payer: BLUE CROSS/BLUE SHIELD

## 2019-11-09 ENCOUNTER — Other Ambulatory Visit: Payer: Self-pay | Admitting: Obstetrics and Gynecology

## 2019-11-09 DIAGNOSIS — Z1231 Encounter for screening mammogram for malignant neoplasm of breast: Secondary | ICD-10-CM

## 2019-11-19 ENCOUNTER — Ambulatory Visit
Admission: RE | Admit: 2019-11-19 | Discharge: 2019-11-19 | Disposition: A | Discharge status: Home / Self Care | Payer: 59 | Source: Ambulatory Visit | Attending: Obstetrics and Gynecology | Admitting: Obstetrics and Gynecology

## 2019-11-19 ENCOUNTER — Other Ambulatory Visit: Payer: Self-pay

## 2019-11-19 DIAGNOSIS — Z1231 Encounter for screening mammogram for malignant neoplasm of breast: Secondary | ICD-10-CM

## 2020-12-20 ENCOUNTER — Other Ambulatory Visit: Payer: Self-pay | Admitting: Family Medicine

## 2020-12-20 DIAGNOSIS — Z1231 Encounter for screening mammogram for malignant neoplasm of breast: Secondary | ICD-10-CM

## 2021-01-24 ENCOUNTER — Other Ambulatory Visit: Payer: Self-pay

## 2021-01-24 ENCOUNTER — Ambulatory Visit
Admission: RE | Admit: 2021-01-24 | Discharge: 2021-01-24 | Disposition: A | Discharge status: Home / Self Care | Payer: 59 | Source: Ambulatory Visit | Attending: Family Medicine | Admitting: Family Medicine

## 2021-01-24 DIAGNOSIS — Z1231 Encounter for screening mammogram for malignant neoplasm of breast: Secondary | ICD-10-CM

## 2021-02-14 ENCOUNTER — Ambulatory Visit (INDEPENDENT_AMBULATORY_CARE_PROVIDER_SITE_OTHER): Payer: 59

## 2021-02-14 ENCOUNTER — Encounter: Payer: Self-pay | Admitting: Podiatry

## 2021-02-14 ENCOUNTER — Ambulatory Visit (INDEPENDENT_AMBULATORY_CARE_PROVIDER_SITE_OTHER): Payer: 59 | Admitting: Podiatry

## 2021-02-14 ENCOUNTER — Other Ambulatory Visit: Payer: Self-pay

## 2021-02-14 DIAGNOSIS — M79672 Pain in left foot: Secondary | ICD-10-CM | POA: Diagnosis not present

## 2021-02-14 DIAGNOSIS — M7751 Other enthesopathy of right foot: Secondary | ICD-10-CM | POA: Diagnosis not present

## 2021-02-14 DIAGNOSIS — M79674 Pain in right toe(s): Secondary | ICD-10-CM

## 2021-02-14 DIAGNOSIS — M722 Plantar fascial fibromatosis: Secondary | ICD-10-CM | POA: Diagnosis not present

## 2021-02-14 MED ORDER — TRIAMCINOLONE ACETONIDE 10 MG/ML IJ SUSP
10.0000 mg | Freq: Once | INTRAMUSCULAR | Status: AC
  Administered 2021-02-14: 10 mg

## 2021-02-14 MED ORDER — DICLOFENAC SODIUM 75 MG PO TBEC: 75.0000 mg | DELAYED_RELEASE_TABLET | Freq: Two times a day (BID) | ORAL | 0 refills | Status: AC

## 2021-02-14 NOTE — Progress Notes (Signed)
Subjective:   Patient ID: Alicia Robles, female   DOB: 55 y.o.   MRN: [id number]   HPI Patient states she has had a lot of pain in her left heel for around 6 months and over the last 2 months her right forefoot has started to get sore and making it hard to walk on.  Does not remember specific injury and states the left heel is just been getting worse over this last.  Patient does not smoke likes to be active   Review of Systems  All other systems reviewed and are negative.      Objective:  Physical Exam Vitals and nursing note reviewed.  Constitutional:      Appearance: She is well-developed.  Pulmonary:     Effort: Pulmonary effort is normal.  Musculoskeletal:        General: Normal range of motion.  Skin:    General: Skin is warm.  Neurological:     Mental Status: She is alert.    Neurovascular status intact muscle strength adequate range of motion within normal limits.  Patient has exquisite discomfort plantar aspect left heel at the insertional point tendon calcaneus inflammation fluid around the medial band and on the right has inflammation and pain around the second MPJ moderate in intensity with no other pathology.  Patient has good digital perfusion well oriented x3     Assessment:  Acute Planter fasciitis left inflammation fluid buildup along with capsulitis right second MPJ which may be due to offloading from the left foot     Plan:  H&P reviewed conditions and we will focus left and may need to do treatment on the right depending on response.  I went ahead today and I did sterile prep and I injected the left plantar fascia 3 mg Kenalog 5 mg Xylocaine and applied fascial brace and instructed on shoe gear modification physical therapy and for the right we will watch it and we placed her on diclofenac 75 mg twice daily and see the response  X-rays indicate spur plantar aspect left heel right shows there is some flattening of the second MPJ but not extensive and its  pathology

## 2021-02-14 NOTE — Patient Instructions (Signed)

## 2021-03-07 ENCOUNTER — Ambulatory Visit: Payer: 59 | Admitting: Podiatry

## 2022-02-19 ENCOUNTER — Other Ambulatory Visit: Payer: Self-pay

## 2022-02-19 MED ORDER — COVID-19 MRNA VAC-TRIS(PFIZER) 30 MCG/0.3ML IM SUSY
PREFILLED_SYRINGE | INTRAMUSCULAR | 0 refills | Status: AC
  Filled 2022-02-19: qty 0.3, 1d supply, fill #0

## 2022-02-19 MED ORDER — INFLUENZA VAC SPLIT QUAD 0.5 ML IM SUSY
PREFILLED_SYRINGE | INTRAMUSCULAR | 0 refills | Status: AC
  Filled 2022-02-19: qty 0.5, 1d supply, fill #0

## 2022-02-25 ENCOUNTER — Other Ambulatory Visit: Payer: Self-pay | Admitting: Family Medicine

## 2022-02-25 DIAGNOSIS — Z1231 Encounter for screening mammogram for malignant neoplasm of breast: Secondary | ICD-10-CM

## 2022-04-23 ENCOUNTER — Ambulatory Visit
Admission: RE | Admit: 2022-04-23 | Discharge: 2022-04-23 | Disposition: A | Discharge status: Home / Self Care | Payer: 59 | Source: Ambulatory Visit | Attending: Family Medicine | Admitting: Family Medicine

## 2022-04-23 DIAGNOSIS — Z1231 Encounter for screening mammogram for malignant neoplasm of breast: Secondary | ICD-10-CM

## 2022-04-24 ENCOUNTER — Other Ambulatory Visit: Payer: Self-pay | Admitting: Family Medicine

## 2022-04-24 DIAGNOSIS — R928 Other abnormal and inconclusive findings on diagnostic imaging of breast: Secondary | ICD-10-CM

## 2022-04-30 ENCOUNTER — Ambulatory Visit
Admission: RE | Admit: 2022-04-30 | Discharge: 2022-04-30 | Disposition: A | Discharge status: Home / Self Care | Payer: 59 | Source: Ambulatory Visit | Attending: Family Medicine | Admitting: Family Medicine

## 2022-04-30 ENCOUNTER — Other Ambulatory Visit: Payer: Self-pay | Admitting: Family Medicine

## 2022-04-30 DIAGNOSIS — R928 Other abnormal and inconclusive findings on diagnostic imaging of breast: Secondary | ICD-10-CM

## 2022-04-30 DIAGNOSIS — R921 Mammographic calcification found on diagnostic imaging of breast: Secondary | ICD-10-CM

## 2022-05-12 IMAGING — MG DIGITAL SCREENING BILAT W/ TOMO W/ CAD
8 series · 8 of 24 positions shown · non-contrast
Comparison: Previous exam(s).

CLINICAL DATA: Screening.

EXAM:
DIGITAL SCREENING BILATERAL MAMMOGRAM WITH TOMO AND CAD

[R MLO synth-2D]
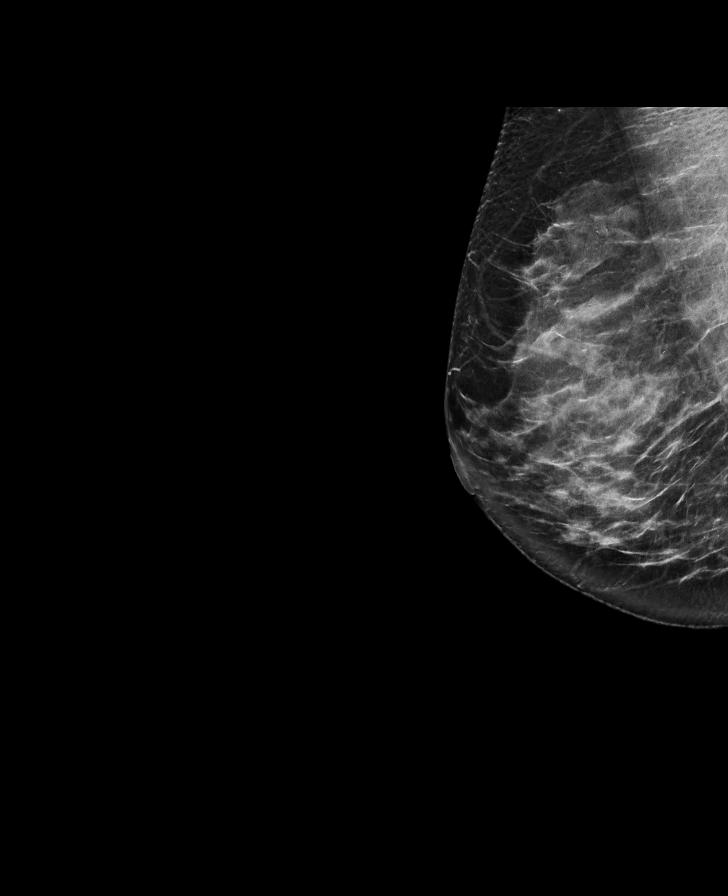

[R CC synth-2D]
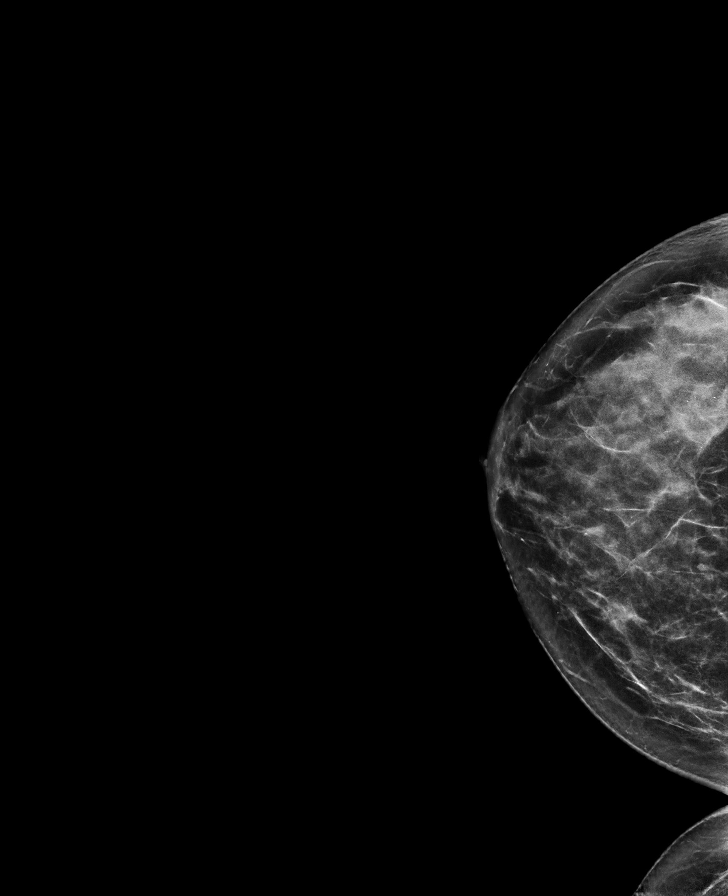

[L CC synth-2D]
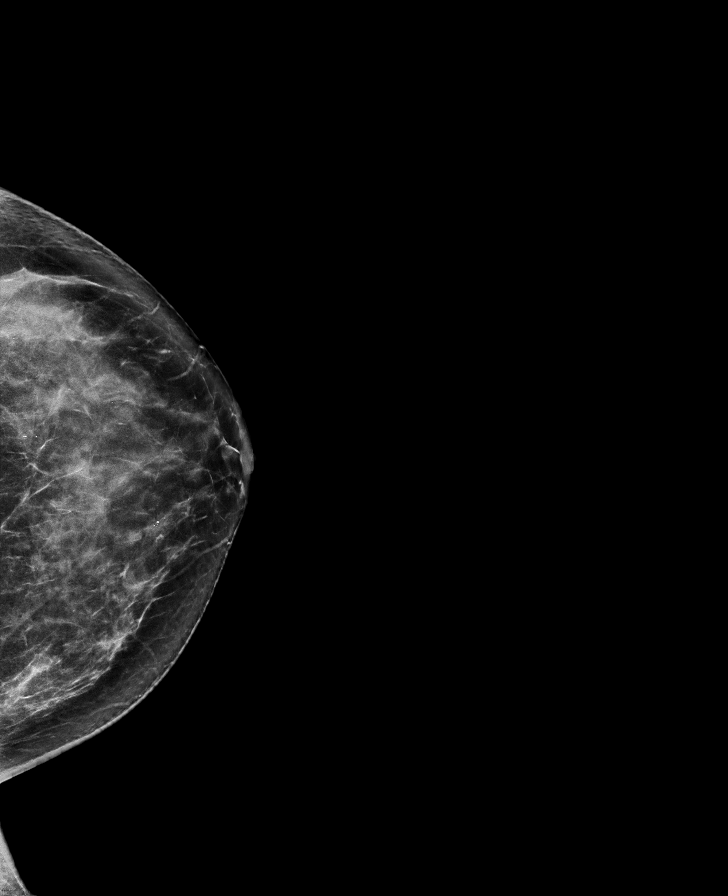

[L MLO synth-2D]
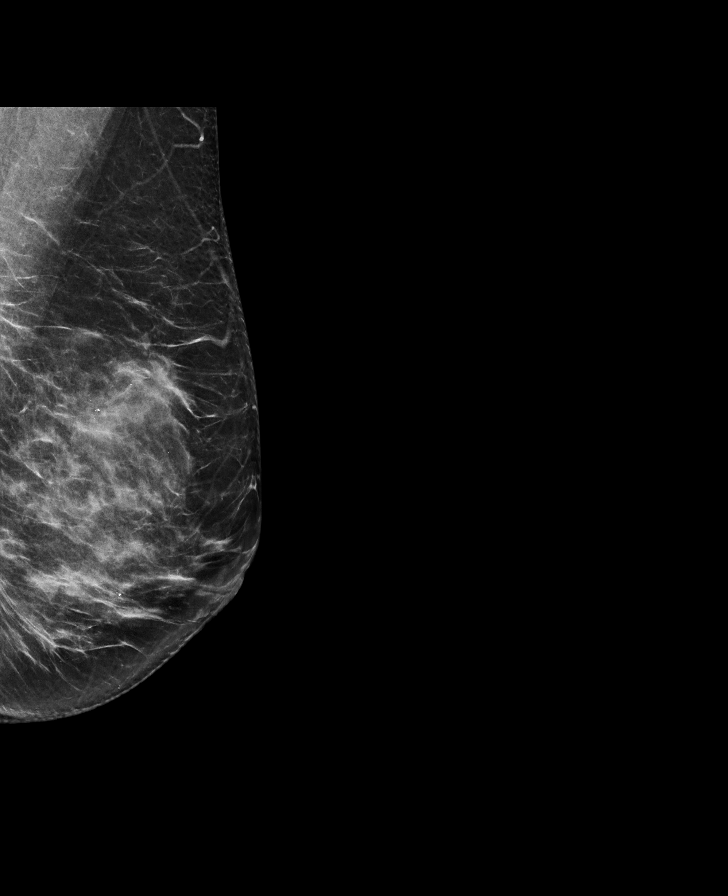

[L MLO tomo · tomo slice 36/71.0]
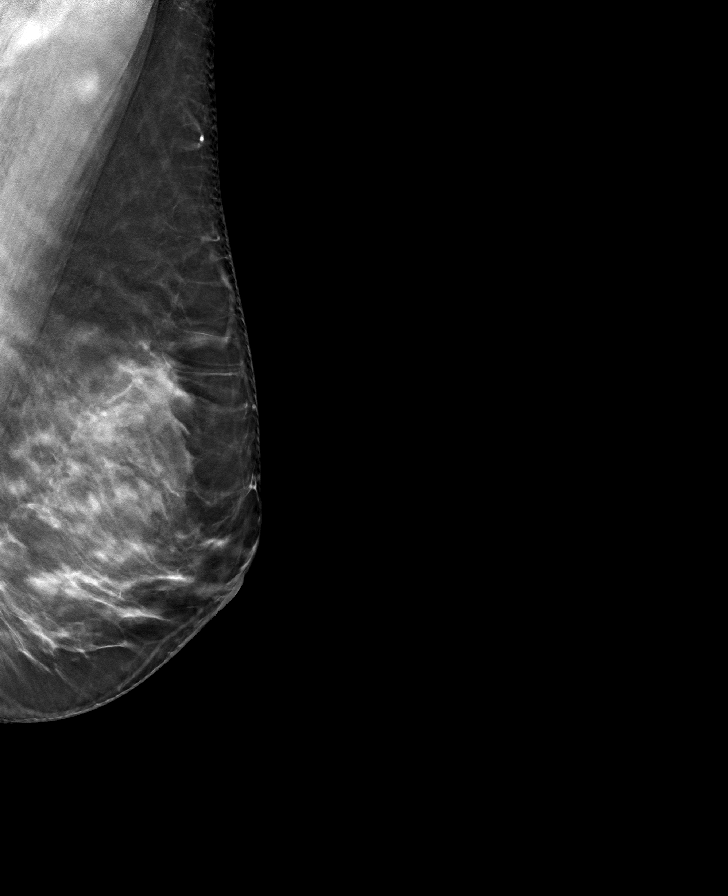

[L CC tomo · tomo slice 36/71.0]
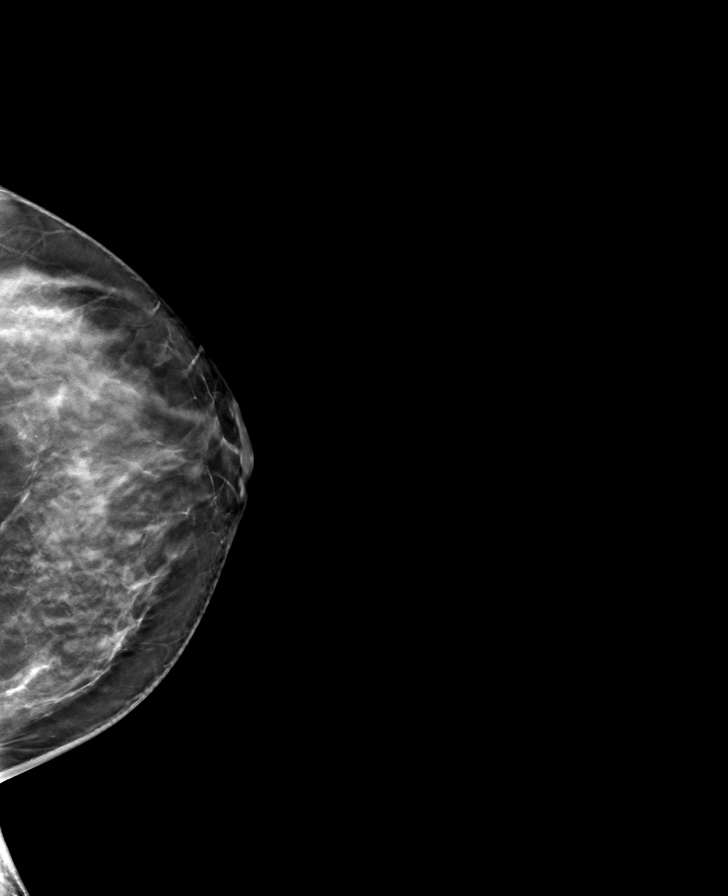

[R CC tomo · tomo slice 35/68.0]
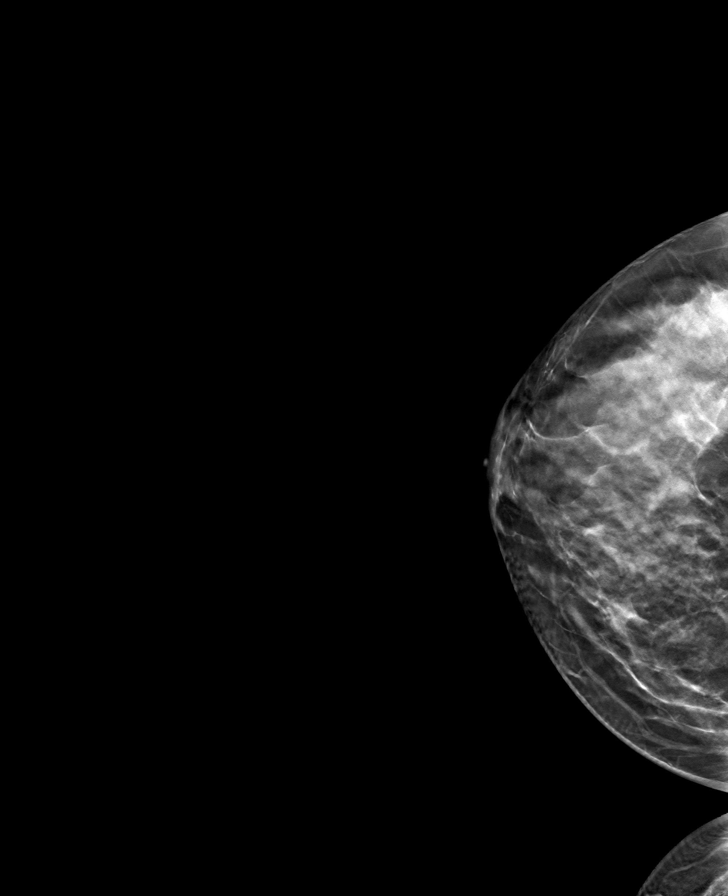

[R MLO tomo · tomo slice 38/75.0]
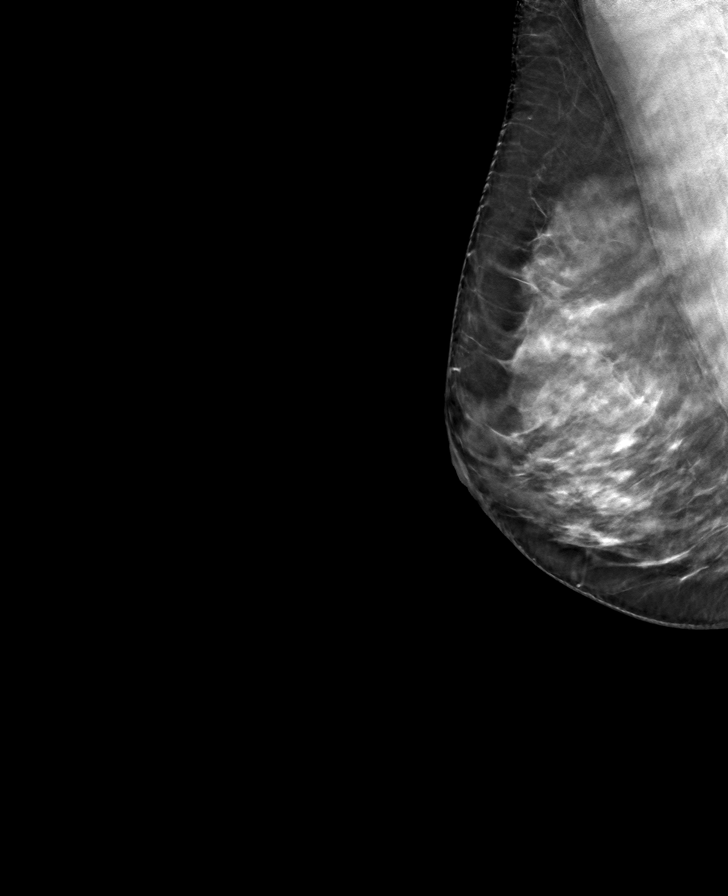

[8 of 24 positions shown; findings below may reference images not displayed]

ACR Breast Density Category c: The breast tissue is heterogeneously
dense, which may obscure small masses.
FINDINGS: There are no findings suspicious for malignancy. Images were
processed with CAD.
IMPRESSION: No mammographic evidence of malignancy. A result letter of this
screening mammogram will be mailed directly to the patient.

RECOMMENDATION:
Screening mammogram in one year. (Code:FT-U-LHB)

BI-RADS CATEGORY  1: Negative.

## 2022-10-30 ENCOUNTER — Ambulatory Visit
Admission: RE | Admit: 2022-10-30 | Discharge: 2022-10-30 | Disposition: A | Discharge status: Home / Self Care | Payer: 59 | Source: Ambulatory Visit | Attending: Family Medicine | Admitting: Family Medicine

## 2022-10-30 ENCOUNTER — Other Ambulatory Visit: Payer: Self-pay | Admitting: Family Medicine

## 2022-10-30 DIAGNOSIS — R928 Other abnormal and inconclusive findings on diagnostic imaging of breast: Secondary | ICD-10-CM

## 2022-10-30 DIAGNOSIS — R921 Mammographic calcification found on diagnostic imaging of breast: Secondary | ICD-10-CM

## 2022-11-11 ENCOUNTER — Ambulatory Visit
Admission: RE | Admit: 2022-11-11 | Discharge: 2022-11-11 | Disposition: A | Discharge status: Home / Self Care | Payer: 59 | Source: Ambulatory Visit | Attending: Family Medicine | Admitting: Family Medicine

## 2022-11-11 DIAGNOSIS — R921 Mammographic calcification found on diagnostic imaging of breast: Secondary | ICD-10-CM

## 2022-11-11 HISTORY — PX: BREAST BIOPSY: SHX20

## 2023-06-26 ENCOUNTER — Other Ambulatory Visit: Payer: Self-pay | Admitting: Family Medicine

## 2023-06-26 DIAGNOSIS — Z1231 Encounter for screening mammogram for malignant neoplasm of breast: Secondary | ICD-10-CM

## 2023-07-08 ENCOUNTER — Ambulatory Visit
Admission: RE | Admit: 2023-07-08 | Discharge: 2023-07-08 | Disposition: A | Discharge status: Home / Self Care | Source: Ambulatory Visit | Attending: Family Medicine | Admitting: Family Medicine

## 2023-07-08 DIAGNOSIS — Z1231 Encounter for screening mammogram for malignant neoplasm of breast: Secondary | ICD-10-CM
